# Patient Record
Sex: Male | Born: 1935 | Race: White | Hispanic: No | Smoking: Former smoker
Health system: Southern US, Community
[De-identification: ages and names within clinical notes are randomized; demographics above are authoritative.]

## PROBLEM LIST (undated history)

## (undated) DIAGNOSIS — R600 Localized edema: Secondary | ICD-10-CM

## (undated) DIAGNOSIS — G4733 Obstructive sleep apnea (adult) (pediatric): Secondary | ICD-10-CM

## (undated) DIAGNOSIS — R7303 Prediabetes: Secondary | ICD-10-CM

## (undated) DIAGNOSIS — E291 Testicular hypofunction: Secondary | ICD-10-CM

## (undated) DIAGNOSIS — I428 Other cardiomyopathies: Secondary | ICD-10-CM

## (undated) DIAGNOSIS — I4821 Permanent atrial fibrillation: Secondary | ICD-10-CM

## (undated) DIAGNOSIS — N200 Calculus of kidney: Secondary | ICD-10-CM

## (undated) DIAGNOSIS — E669 Obesity, unspecified: Secondary | ICD-10-CM

## (undated) DIAGNOSIS — R609 Edema, unspecified: Secondary | ICD-10-CM

## (undated) HISTORY — DX: Obstructive sleep apnea (adult) (pediatric): G47.33

## (undated) HISTORY — DX: Localized edema: R60.0

## (undated) HISTORY — DX: Permanent atrial fibrillation: I48.21

## (undated) HISTORY — DX: Other cardiomyopathies: I42.8

## (undated) HISTORY — DX: Obesity, unspecified: E66.9

## (undated) HISTORY — DX: Testicular hypofunction: E29.1

## (undated) HISTORY — DX: Edema, unspecified: R60.9

## (undated) HISTORY — DX: Calculus of kidney: N20.0

## (undated) HISTORY — DX: Prediabetes: R73.03

---

## 1998-12-26 ENCOUNTER — Encounter: Payer: Self-pay | Admitting: Family Medicine

## 1998-12-26 ENCOUNTER — Ambulatory Visit (HOSPITAL_COMMUNITY): Admission: RE | Admit: 1998-12-26 | Discharge: 1998-12-26 | Payer: Self-pay | Admitting: Family Medicine

## 2000-05-25 ENCOUNTER — Emergency Department (HOSPITAL_COMMUNITY): Admission: EM | Admit: 2000-05-25 | Discharge: 2000-05-25 | Payer: Self-pay | Admitting: Emergency Medicine

## 2000-05-25 ENCOUNTER — Encounter: Payer: Self-pay | Admitting: Emergency Medicine

## 2000-08-18 ENCOUNTER — Ambulatory Visit (HOSPITAL_BASED_OUTPATIENT_CLINIC_OR_DEPARTMENT_OTHER): Admission: RE | Admit: 2000-08-18 | Discharge: 2000-08-18 | Payer: Self-pay | Admitting: Family Medicine

## 2000-11-11 ENCOUNTER — Encounter: Payer: Self-pay | Admitting: Otolaryngology

## 2000-11-13 ENCOUNTER — Encounter (INDEPENDENT_AMBULATORY_CARE_PROVIDER_SITE_OTHER): Payer: Self-pay | Admitting: *Deleted

## 2000-11-13 ENCOUNTER — Ambulatory Visit (HOSPITAL_COMMUNITY): Admission: RE | Admit: 2000-11-13 | Discharge: 2000-11-14 | Payer: Self-pay | Admitting: Otolaryngology

## 2002-11-01 ENCOUNTER — Ambulatory Visit (HOSPITAL_COMMUNITY): Admission: RE | Admit: 2002-11-01 | Discharge: 2002-11-01 | Payer: Self-pay | Admitting: Urology

## 2002-11-01 ENCOUNTER — Encounter: Payer: Self-pay | Admitting: Urology

## 2003-10-13 ENCOUNTER — Encounter (INDEPENDENT_AMBULATORY_CARE_PROVIDER_SITE_OTHER): Payer: Self-pay | Admitting: Specialist

## 2003-10-13 ENCOUNTER — Ambulatory Visit (HOSPITAL_COMMUNITY): Admission: RE | Admit: 2003-10-13 | Discharge: 2003-10-13 | Payer: Self-pay | Admitting: *Deleted

## 2007-09-03 ENCOUNTER — Ambulatory Visit (HOSPITAL_COMMUNITY): Admission: RE | Admit: 2007-09-03 | Discharge: 2007-09-03 | Payer: Self-pay | Admitting: Urology

## 2007-09-10 ENCOUNTER — Ambulatory Visit (HOSPITAL_COMMUNITY): Admission: RE | Admit: 2007-09-10 | Discharge: 2007-09-10 | Payer: Self-pay | Admitting: Urology

## 2008-04-04 ENCOUNTER — Encounter: Admission: RE | Admit: 2008-04-04 | Discharge: 2008-04-04 | Payer: Self-pay | Admitting: Family Medicine

## 2009-08-15 ENCOUNTER — Ambulatory Visit (HOSPITAL_BASED_OUTPATIENT_CLINIC_OR_DEPARTMENT_OTHER): Admission: RE | Admit: 2009-08-15 | Discharge: 2009-08-15 | Payer: Self-pay | Admitting: Urology

## 2010-05-06 LAB — POCT I-STAT 4, (NA,K, GLUC, HGB,HCT)
Glucose, Bld: 110 mg/dL — ABNORMAL HIGH (ref 70–99)
Potassium: 4.8 mEq/L (ref 3.5–5.1)
Sodium: 147 mEq/L — ABNORMAL HIGH (ref 135–145)

## 2010-07-06 NOTE — H&P (Signed)
[place]  Patient:    Antonio Mendoza, Antonio Mendoza Visit Number: [id number] MRN: [id number]          Service Type: DSU Location: RCRM 2550 08 Attending Physician:  Waldon Merl Dictated by:   Keturah Barre, M.D. Admit Date:  11/13/2000   CC:         Meredith Staggers, M.D.   History and Physical  HISTORY OF PRESENT ILLNESS:  The patient is a 77 year old male who has had persistent sleep apnea problems.  He has had a split study which shows a moderate obstructive sleep apnea syndrome with a 26 RDI, lowest O2 85%.  He is awake 24 times in the night, more than two minutes 10 times.  He is chronically fatigued.  He just cannot seem to get enough sleep even though he will try to sleep 10 hours a day.  He had a normal cardiac rhythm during these times and a CPAP titration attempted was unsuccessful.  This was stopped because of mass discomfort.  He also has a severe septal deviation which would make CPAP quite essentially impossible.  He now enters for a septal reconstruction, turbinate reduction and a palatopharyngoplasty.  ALLERGIES:  No known drug allergies.  PAST SURGICAL HISTORY:  He has had a T&A in the past as a child.  Lithotripsy and colonoscopy.  He also had surgery for kidney stones.  He has had eye surgery on his left eye.  REVIEW OF SYSTEMS:  He had one decreased heart rate episode but no problem, his heart rate was 95.  He has been checked and found to be in good condition cardiacwise.  He has had no other GI, endocrine, hematology, neurology or pulmonary problems.  PHYSICAL EXAMINATION:  GENERAL APPEARANCE:  VITAL SIGNS:  Blood pressure 140/82, he is 6 feet 2-1/2 inches, weighs 235, his heart rate is 72.  HEENT:  His ears are clear.  His oral cavity is very small.  He shows evidence of history of of tonsillectomy.  His septum is deviated, blocking his nose. His larynx is clear.  No ulcerations or masses on true cords,  false cords, epiglottis, or base of tongue.  NECK:  Full.  No thyromegaly, cervical adenopathy or masses.  It is very supple and full.  CHEST:  Clear with no wheezing, rhonchi or rales.  CARDIOVASCULAR:  ______________, murmurs or gallops.  ABDOMEN:  No organomegaly, tenderness or mass.  INITIAL DIAGNOSES: 1. Sleep apnea with septal deviation, turbinate hypertrophy with a small oral    cavity, failed CPAP. 2. History of kidney stones. 3. History of colonoscopy in 2002. 4. History of lithotripsy and left eye surgery and T&A.  PLAN:  Do a palatopharyngoplasty and a septal reconstruction under general endotracheal anesthesia. Dictated by:   Keturah Barre, M.D. Attending Physician:  Waldon Merl DD:  11/13/00 TD:  11/13/00 Job: [id number]

## 2010-07-06 NOTE — Op Note (Signed)
[place]  Patient:    Antonio Mendoza, Antonio Mendoza Visit Number: [id number] MRN: [id number]          Service Type: DSU Location: RCRM 2550 08 Attending Physician:  Waldon Merl Dictated by:   Keturah Barre, M.D. Proc. Date: 11/13/00 Admit Date:  11/13/2000   CC:         Meredith Staggers, M.D.   Operative Report  HISTORY:  This patient is a 77 year old male who has had a sleep study who is chronically fatigued.  He is getting inadequate sleep.  His RDI show it to be respiratory disturbance index of 24, lowest 02 85.  He was up 24 times, 10 times greater than 2 minutes.  He is chronically fatigued and is falling asleep when he just sits down.  We also talked to him about the risks of the procedure, the persistent sore throat for at least 10 days to 2 weeks and he is not to travel for about 10 days to 2 weeks, and he needs to be on a very soft diet and bland diet.  He has also failed at CPAP, which he had split test.  Because he had severe septal deviation and nasal obstruction which made the CPAP inappropriate and also aggravate his sleep apnea.  PREOPERATIVE DIAGNOSIS:  Sleep apnea with septal deviation, turbinate hypertrophy.  POSTOPERATIVE DIAGNOSIS:  Sleep apnea with septal deviation, turbinate hypertrophy.  PROCEDURE:  Septal reconstruction turbinate reduction with a uvulopalatoplasty.  SURGEON:  Keturah Barre, M.D.  ANESTHESIA:  General endotracheal anesthesia.  ANESTHESIOLOGIST:  Bedelia Person, M.D.  DESCRIPTION OF PROCEDURE:  The patient was placed in the supine position under general endotracheal anesthesia the nose was first approached where the septum was found to be deviated grossly to the left.  His turbinates were quite large and they were reduced aggressively using the butter knife to crush them laterally but no mucous membrane was taken.  The septum was then apricot where a hemitransfixion incision was made in  the left side and carried back along the septal quadrilateral cartilage, elevating the mucous membrane back to the ethmoids and vomerine septal deviation.  A strip of cartilage was then taken from the floor of the nose as well as the posterior quadrilateral cartilage and then open and closed Jansen-Middleton were used to remove the septal ethmoid vomerine deviation and the remainder of the quadrilateral cartilage was placed back on its premaxillary crest.  Once the septum was established in the midline the closure was begun using a 5-0 plain catgut and a through and through septal suture was used x 2 as a hemostatic blanket stitch using 4-0 plain.  Once this was achieved Telfa dressing was placed.  Packing was placed, and then attention was carried to the oral cavity.  The tonsillar gag was placed after I repositioned and then the uvula and palate were redundant, low and increased in size.  The palate was trimmed removing approximately 6 mm of its lower edge.  No muscle was taken, and the palate was also increased in size and this was trimmed taking approximately 1/2 to make it more appropriate in size.  The palatal mucous membrane and uvula mucous membrane was sutured anterior and posterior, mucous membrane approximated.  Final hemostasis was established with Bovie electrocoagulation.  Once this was completely dry the stomach was suctioned.  The nasopharynx was suctioned.  The packing was removed and anesthesia trumpets were placed, #8 and #7 ______ and the patient was taken  to recovery room in good condition with an excellent airway.  The patient tolerated the procedure very well, did well postoperative and his follow up will be overnight pulse oximeter observation and then he will be seeing me in the office in 1 week, 2 weeks, 3 weeks, 6 weeks, 3 months, 6 months and 1 year. Dictated by:   Keturah Barre, M.D. Attending Physician:  Waldon Merl DD:  11/13/00 TD:   11/13/00 Job: [id number]

## 2010-11-16 LAB — COMPREHENSIVE METABOLIC PANEL
AST: 40 — ABNORMAL HIGH
CO2: 20
Calcium: 8.9
Creatinine, Ser: 1.83 — ABNORMAL HIGH
GFR calc Af Amer: 44 — ABNORMAL LOW
GFR calc non Af Amer: 36 — ABNORMAL LOW

## 2010-11-16 LAB — CBC
MCHC: 34.1
MCV: 94.2
RBC: 5.19
RDW: 14.4

## 2011-06-28 DIAGNOSIS — J40 Bronchitis, not specified as acute or chronic: Secondary | ICD-10-CM | POA: Diagnosis not present

## 2011-07-17 DIAGNOSIS — N3020 Other chronic cystitis without hematuria: Secondary | ICD-10-CM | POA: Diagnosis not present

## 2011-07-17 DIAGNOSIS — E291 Testicular hypofunction: Secondary | ICD-10-CM | POA: Diagnosis not present

## 2011-07-17 DIAGNOSIS — N200 Calculus of kidney: Secondary | ICD-10-CM | POA: Diagnosis not present

## 2011-08-12 DIAGNOSIS — L570 Actinic keratosis: Secondary | ICD-10-CM | POA: Diagnosis not present

## 2011-08-12 DIAGNOSIS — D485 Neoplasm of uncertain behavior of skin: Secondary | ICD-10-CM | POA: Diagnosis not present

## 2011-08-15 DIAGNOSIS — E291 Testicular hypofunction: Secondary | ICD-10-CM | POA: Diagnosis not present

## 2011-10-28 DIAGNOSIS — I4891 Unspecified atrial fibrillation: Secondary | ICD-10-CM | POA: Diagnosis not present

## 2011-10-28 DIAGNOSIS — E669 Obesity, unspecified: Secondary | ICD-10-CM | POA: Diagnosis not present

## 2011-10-28 DIAGNOSIS — I428 Other cardiomyopathies: Secondary | ICD-10-CM | POA: Diagnosis not present

## 2011-11-15 ENCOUNTER — Other Ambulatory Visit: Payer: Self-pay | Admitting: Gastroenterology

## 2011-11-15 DIAGNOSIS — Z09 Encounter for follow-up examination after completed treatment for conditions other than malignant neoplasm: Secondary | ICD-10-CM | POA: Diagnosis not present

## 2011-11-15 DIAGNOSIS — K648 Other hemorrhoids: Secondary | ICD-10-CM | POA: Diagnosis not present

## 2011-11-15 DIAGNOSIS — Z86010 Personal history of colonic polyps: Secondary | ICD-10-CM | POA: Diagnosis not present

## 2011-11-15 DIAGNOSIS — K5730 Diverticulosis of large intestine without perforation or abscess without bleeding: Secondary | ICD-10-CM | POA: Diagnosis not present

## 2011-11-15 DIAGNOSIS — D126 Benign neoplasm of colon, unspecified: Secondary | ICD-10-CM | POA: Diagnosis not present

## 2011-11-18 DIAGNOSIS — Z79899 Other long term (current) drug therapy: Secondary | ICD-10-CM | POA: Diagnosis not present

## 2012-01-02 DIAGNOSIS — H2510 Age-related nuclear cataract, unspecified eye: Secondary | ICD-10-CM | POA: Diagnosis not present

## 2012-01-20 DIAGNOSIS — N400 Enlarged prostate without lower urinary tract symptoms: Secondary | ICD-10-CM | POA: Diagnosis not present

## 2012-01-20 DIAGNOSIS — N3020 Other chronic cystitis without hematuria: Secondary | ICD-10-CM | POA: Diagnosis not present

## 2012-01-20 DIAGNOSIS — E291 Testicular hypofunction: Secondary | ICD-10-CM | POA: Diagnosis not present

## 2012-04-16 DIAGNOSIS — Z79899 Other long term (current) drug therapy: Secondary | ICD-10-CM | POA: Diagnosis not present

## 2012-04-16 DIAGNOSIS — I4891 Unspecified atrial fibrillation: Secondary | ICD-10-CM | POA: Diagnosis not present

## 2012-04-27 DIAGNOSIS — L219 Seborrheic dermatitis, unspecified: Secondary | ICD-10-CM | POA: Diagnosis not present

## 2012-05-01 DIAGNOSIS — E291 Testicular hypofunction: Secondary | ICD-10-CM | POA: Diagnosis not present

## 2012-05-01 DIAGNOSIS — R972 Elevated prostate specific antigen [PSA]: Secondary | ICD-10-CM | POA: Diagnosis not present

## 2012-07-15 DIAGNOSIS — E291 Testicular hypofunction: Secondary | ICD-10-CM | POA: Diagnosis not present

## 2012-07-27 DIAGNOSIS — I4891 Unspecified atrial fibrillation: Secondary | ICD-10-CM | POA: Diagnosis not present

## 2012-07-27 DIAGNOSIS — R609 Edema, unspecified: Secondary | ICD-10-CM | POA: Diagnosis not present

## 2012-07-27 DIAGNOSIS — I428 Other cardiomyopathies: Secondary | ICD-10-CM | POA: Diagnosis not present

## 2012-07-27 DIAGNOSIS — R0602 Shortness of breath: Secondary | ICD-10-CM | POA: Diagnosis not present

## 2012-09-10 DIAGNOSIS — R040 Epistaxis: Secondary | ICD-10-CM | POA: Diagnosis not present

## 2012-10-15 DIAGNOSIS — I4891 Unspecified atrial fibrillation: Secondary | ICD-10-CM | POA: Diagnosis not present

## 2012-10-15 DIAGNOSIS — Z79899 Other long term (current) drug therapy: Secondary | ICD-10-CM | POA: Diagnosis not present

## 2012-10-23 DIAGNOSIS — D751 Secondary polycythemia: Secondary | ICD-10-CM | POA: Diagnosis not present

## 2012-10-23 DIAGNOSIS — N3020 Other chronic cystitis without hematuria: Secondary | ICD-10-CM | POA: Diagnosis not present

## 2012-10-23 DIAGNOSIS — R972 Elevated prostate specific antigen [PSA]: Secondary | ICD-10-CM | POA: Diagnosis not present

## 2012-10-23 DIAGNOSIS — E291 Testicular hypofunction: Secondary | ICD-10-CM | POA: Diagnosis not present

## 2012-11-16 DIAGNOSIS — IMO0002 Reserved for concepts with insufficient information to code with codable children: Secondary | ICD-10-CM | POA: Diagnosis not present

## 2012-11-24 DIAGNOSIS — Z23 Encounter for immunization: Secondary | ICD-10-CM | POA: Diagnosis not present

## 2012-11-24 DIAGNOSIS — L02519 Cutaneous abscess of unspecified hand: Secondary | ICD-10-CM | POA: Diagnosis not present

## 2012-11-24 DIAGNOSIS — Z0000 Encounter for general adult medical examination without abnormal findings: Secondary | ICD-10-CM | POA: Diagnosis not present

## 2012-11-24 DIAGNOSIS — I4891 Unspecified atrial fibrillation: Secondary | ICD-10-CM | POA: Diagnosis not present

## 2012-11-27 DIAGNOSIS — N3020 Other chronic cystitis without hematuria: Secondary | ICD-10-CM | POA: Diagnosis not present

## 2012-11-27 DIAGNOSIS — R972 Elevated prostate specific antigen [PSA]: Secondary | ICD-10-CM | POA: Diagnosis not present

## 2012-11-27 DIAGNOSIS — D751 Secondary polycythemia: Secondary | ICD-10-CM | POA: Diagnosis not present

## 2012-12-17 ENCOUNTER — Telehealth: Payer: Self-pay

## 2012-12-17 MED ORDER — APIXABAN 5 MG PO TABS: 5.0000 mg | ORAL_TABLET | Freq: Two times a day (BID) | ORAL | Status: DC

## 2012-12-17 NOTE — Telephone Encounter (Signed)
Refilled

## 2013-01-22 ENCOUNTER — Telehealth: Payer: Self-pay | Admitting: *Deleted

## 2013-01-22 DIAGNOSIS — R972 Elevated prostate specific antigen [PSA]: Secondary | ICD-10-CM | POA: Diagnosis not present

## 2013-01-22 NOTE — Telephone Encounter (Signed)
Patient requests refill of lasix to be sent to [facility]. Thanks, MI

## 2013-01-25 MED ORDER — FUROSEMIDE 40 MG PO TABS: 40.0000 mg | ORAL_TABLET | Freq: Every day | ORAL | Status: DC

## 2013-01-25 NOTE — Telephone Encounter (Signed)
Refilled

## 2013-01-27 DIAGNOSIS — E291 Testicular hypofunction: Secondary | ICD-10-CM | POA: Diagnosis not present

## 2013-01-27 DIAGNOSIS — N200 Calculus of kidney: Secondary | ICD-10-CM | POA: Diagnosis not present

## 2013-01-27 DIAGNOSIS — N3020 Other chronic cystitis without hematuria: Secondary | ICD-10-CM | POA: Diagnosis not present

## 2013-01-27 DIAGNOSIS — R6882 Decreased libido: Secondary | ICD-10-CM | POA: Diagnosis not present

## 2013-03-02 DIAGNOSIS — E291 Testicular hypofunction: Secondary | ICD-10-CM | POA: Diagnosis not present

## 2013-03-04 DIAGNOSIS — Z961 Presence of intraocular lens: Secondary | ICD-10-CM | POA: Diagnosis not present

## 2013-03-04 DIAGNOSIS — H2510 Age-related nuclear cataract, unspecified eye: Secondary | ICD-10-CM | POA: Diagnosis not present

## 2013-03-08 DIAGNOSIS — N200 Calculus of kidney: Secondary | ICD-10-CM | POA: Diagnosis not present

## 2013-03-08 DIAGNOSIS — N3020 Other chronic cystitis without hematuria: Secondary | ICD-10-CM | POA: Diagnosis not present

## 2013-03-14 ENCOUNTER — Other Ambulatory Visit: Payer: Self-pay | Admitting: *Deleted

## 2013-03-14 DIAGNOSIS — Z79899 Other long term (current) drug therapy: Secondary | ICD-10-CM

## 2013-03-14 DIAGNOSIS — I4891 Unspecified atrial fibrillation: Secondary | ICD-10-CM

## 2013-04-19 ENCOUNTER — Other Ambulatory Visit: Payer: Self-pay

## 2013-04-20 ENCOUNTER — Telehealth: Payer: Self-pay | Admitting: Interventional Cardiology

## 2013-04-20 MED ORDER — METOPROLOL SUCCINATE ER 100 MG PO TB24: 100.0000 mg | ORAL_TABLET | Freq: Every day | ORAL | Status: DC

## 2013-04-20 MED ORDER — APIXABAN 5 MG PO TABS: 5.0000 mg | ORAL_TABLET | Freq: Two times a day (BID) | ORAL | Status: DC

## 2013-04-20 MED ORDER — FUROSEMIDE 40 MG PO TABS: 40.0000 mg | ORAL_TABLET | Freq: Every day | ORAL | Status: DC

## 2013-04-20 MED ORDER — LISINOPRIL 5 MG PO TABS: 5.0000 mg | ORAL_TABLET | Freq: Every day | ORAL | Status: DC

## 2013-04-20 NOTE — Telephone Encounter (Signed)
New message    Patient calling need a refill on all 4 medication    Sent to: [facility] [phone]

## 2013-04-20 NOTE — Telephone Encounter (Signed)
REFILLED. [facility].

## 2013-05-12 DIAGNOSIS — E291 Testicular hypofunction: Secondary | ICD-10-CM | POA: Diagnosis not present

## 2013-05-19 DIAGNOSIS — D751 Secondary polycythemia: Secondary | ICD-10-CM | POA: Diagnosis not present

## 2013-05-19 DIAGNOSIS — E291 Testicular hypofunction: Secondary | ICD-10-CM | POA: Diagnosis not present

## 2013-05-19 DIAGNOSIS — N3020 Other chronic cystitis without hematuria: Secondary | ICD-10-CM | POA: Diagnosis not present

## 2013-07-26 ENCOUNTER — Encounter: Payer: Self-pay | Admitting: Cardiology

## 2013-07-26 ENCOUNTER — Encounter: Payer: Self-pay | Admitting: Interventional Cardiology

## 2013-07-26 ENCOUNTER — Encounter (INDEPENDENT_AMBULATORY_CARE_PROVIDER_SITE_OTHER): Payer: Self-pay

## 2013-07-26 ENCOUNTER — Ambulatory Visit (INDEPENDENT_AMBULATORY_CARE_PROVIDER_SITE_OTHER): Payer: Medicare Other | Admitting: Interventional Cardiology

## 2013-07-26 VITALS — BP 122/73 | HR 66 | Ht 74.5 in | Wt 241.4 lb

## 2013-07-26 DIAGNOSIS — G4733 Obstructive sleep apnea (adult) (pediatric): Secondary | ICD-10-CM | POA: Insufficient documentation

## 2013-07-26 DIAGNOSIS — Z8679 Personal history of other diseases of the circulatory system: Secondary | ICD-10-CM | POA: Insufficient documentation

## 2013-07-26 DIAGNOSIS — I4891 Unspecified atrial fibrillation: Secondary | ICD-10-CM | POA: Diagnosis not present

## 2013-07-26 DIAGNOSIS — I428 Other cardiomyopathies: Secondary | ICD-10-CM

## 2013-07-26 DIAGNOSIS — R609 Edema, unspecified: Secondary | ICD-10-CM

## 2013-07-26 DIAGNOSIS — I4821 Permanent atrial fibrillation: Secondary | ICD-10-CM | POA: Insufficient documentation

## 2013-07-26 HISTORY — DX: Other cardiomyopathies: I42.8

## 2013-07-26 LAB — CBC
HCT: 48.7 % (ref 39.0–52.0)
HEMOGLOBIN: 16.4 g/dL (ref 13.0–17.0)
MCHC: 33.7 g/dL (ref 30.0–36.0)
MCV: 96.9 fl (ref 78.0–100.0)
Platelets: 217 10*3/uL (ref 150.0–400.0)
RBC: 5.03 Mil/uL (ref 4.22–5.81)
RDW: 14.4 % (ref 11.5–15.5)
WBC: 8.8 10*3/uL (ref 4.0–10.5)

## 2013-07-26 LAB — BASIC METABOLIC PANEL
BUN: 17 mg/dL (ref 6–23)
CALCIUM: 9.3 mg/dL (ref 8.4–10.5)
CO2: 29 mEq/L (ref 19–32)
Chloride: 105 mEq/L (ref 96–112)
Creatinine, Ser: 1.1 mg/dL (ref 0.4–1.5)
GFR: 66.74 mL/min (ref >60.00)
GLUCOSE: 83 mg/dL (ref 70–99)
POTASSIUM: 4 meq/L (ref 3.5–5.1)
Sodium: 140 mEq/L (ref 135–145)

## 2013-07-26 NOTE — Progress Notes (Signed)
Patient ID: Antonio Mendoza, male   DOB: Jul 07, 1929, 80 y.o.   MRN: [id number]    [place], [place],   [address] Phone: [phone] Fax:  [phone]  Date:  07/26/2013   ID:  Antonio Mendoza, DOB 1929/05/28, MRN [id number]  PCP:  Vena Austria, MD      History of Present Illness: Antonio Mendoza is a 80 y.o. male who has chronic AFib and mildly decreased LV function. He walks twice a week and feels okay with that, but smetiumes, this drops off. He skeet shoots. Occasional chest pain after holding his gun. No chest pain related to walking. Atrial Fibrillation F/U:  He feels that his balance is off. He walks twice a week without problems.  No falls.  c/o Leg edema more at the end of the day; wears compression stockings.  c/o Shortness of breath exacerbated by activity. He has used advair in the past without much change..  Denies : Chest pain.  Dizziness while getting up from sitting position.  Orthopnea.  Palpitations.  Syncope.   Has more daytime sleepiness, doesn't wear his CPAP at night. Feels his balance is off as he gets older. Otherwise feels well. Denies dizziness, syncope, near syncope, fall, dyspnea, palpitations, chest pain, orthopnea, PND, LE edema, and stroke-like symptoms. No bleeding. BP is well controlled at home, usually 115-120/70-75.  Exercises once a week, walks for about 59min.    Wt Readings from Last 3 Encounters:  07/26/13 241 lb 6.4 oz (109.498 kg)     Past Medical History  Diagnosis Date  . Sleep apnea     tried cpap , but did not tolerate   . A-fib     since 1996  . Kidney stones   . Hypogonadism male     followed by urologist     Current Outpatient Prescriptions  Medication Sig Dispense Refill  . apixaban (ELIQUIS) 5 MG TABS tablet Take 1 tablet (5 mg total) by mouth 2 (two) times daily.  180 tablet  1  . furosemide (LASIX) 40 MG tablet Take 1 tablet (40 mg total) by mouth daily.  90 tablet  1  . lisinopril  (PRINIVIL,ZESTRIL) 5 MG tablet Take 1 tablet (5 mg total) by mouth daily.  90 tablet  3  . metoprolol succinate (TOPROL-XL) 100 MG 24 hr tablet Take 1 tablet (100 mg total) by mouth daily. Take with or immediately following a meal.  90 tablet  1  . sildenafil (VIAGRA) 50 MG tablet Take 50 mg by mouth daily as needed for erectile dysfunction.      . Testosterone (ANDROGEL) 40.5 MG/2.5GM (1.62%) GEL Place onto the skin.       No current facility-administered medications for this visit.    Allergies:   No Known Allergies  Social History:  The patient  reports that he has quit smoking. He does not have any smokeless tobacco history on file. He reports that he drinks alcohol. He reports that he does not use illicit drugs.   Family History:  The patient's family history includes CAD in his brother.   ROS:  Please see the history of present illness.  No nausea, vomiting.  No fevers, chills.  No focal weakness.  No dysuria.   All other systems reviewed and negative.   PHYSICAL EXAM: VS:  BP 122/73  Pulse 66  Ht 6' 2.5" (1.892 m)  Wt 241 lb 6.4 oz (109.498 kg)  BMI 30.59 kg/m2 Well nourished, well developed,  in no acute distress HEENT: normal Neck: no JVD, no carotid bruits Cardiac:  normal S1, S2; irregularly irregular Lungs:  clear to auscultation bilaterally, no wheezing, rhonchi or rales Abd: soft, nontender, no hepatomegaly Ext: no edema Skin: warm and dry Neuro:   no focal abnormalities noted  EKG:  Afib , rate controlled, Q waves in lead III.    ASSESSMENT AND PLAN:  Atrial fibrillation  Continue Metoprolol Succinate Tablet Extended Release 24 Hour, 100 mg, TAKE 1 TABLET ONE TIME DAILY Continue Eliquis 5 mg Tablet, ., 1 tablet, orally, twice a day IMAGING: EKG   Harward,Amy 07/27/2012 09:39:56 AM > Marcie Shearon,JAY 07/27/2012 10:00:15 AM > AFib, rate controlled.   Notes: No sx of tachycardia.  2. Cardiomyopathy  Continue Lisinopril Tablet, 5 MG, TAKE 1 TABLET ONE TIME  DAILY Continue Furosemide Tablet, 40 MG, TAKE 1 TABLET ONE TIME DAILY Notes: EF 50-55% in the past.  3. Shortness of breath  LAB: Basic Metabolic Cr 1.3 in 1610 LAB: BNP in 79 in 2014 Notes: Better today. 4. Edema of legs  Notes: Improved with compression stockings.   5. Fatigue: may be related to OSA.  He does not tolerate CPAP. Also has had problems getting testosterone. Will recheck sleep study.   Signed, Mina Marble, MD, [facility] 07/26/2013 10:12 AM

## 2013-07-26 NOTE — Patient Instructions (Signed)
Your physician has recommended that you have a sleep study. This test records several body functions during sleep, including: brain activity, eye movement, oxygen and carbon dioxide blood levels, heart rate and rhythm, breathing rate and rhythm, the flow of air through your mouth and nose, snoring, body muscle movements, and chest and belly movement.  Your physician recommends that you return for lab work today for cbc and bmet.  Your physician wants you to follow-up in: 1 year with Dr. Irish Lack.  You will receive a reminder letter in the mail two months in advance. If you don't receive a letter, please call our office to schedule the follow-up appointment.

## 2013-07-27 ENCOUNTER — Other Ambulatory Visit: Payer: Self-pay | Admitting: Cardiology

## 2013-07-27 DIAGNOSIS — I4891 Unspecified atrial fibrillation: Secondary | ICD-10-CM

## 2013-08-02 ENCOUNTER — Other Ambulatory Visit: Payer: Self-pay

## 2013-08-02 MED ORDER — LISINOPRIL 5 MG PO TABS: 5.0000 mg | ORAL_TABLET | Freq: Every day | ORAL | Status: DC

## 2013-08-02 MED ORDER — METOPROLOL SUCCINATE ER 100 MG PO TB24: 100.0000 mg | ORAL_TABLET | Freq: Every day | ORAL | Status: DC

## 2013-08-02 MED ORDER — FUROSEMIDE 40 MG PO TABS: 40.0000 mg | ORAL_TABLET | Freq: Every day | ORAL | Status: DC

## 2013-08-02 MED ORDER — APIXABAN 5 MG PO TABS: 5.0000 mg | ORAL_TABLET | Freq: Two times a day (BID) | ORAL | Status: DC

## 2013-08-11 ENCOUNTER — Encounter: Payer: Self-pay | Admitting: Cardiology

## 2013-08-11 DIAGNOSIS — G4733 Obstructive sleep apnea (adult) (pediatric): Secondary | ICD-10-CM | POA: Diagnosis not present

## 2013-08-18 ENCOUNTER — Telehealth: Payer: Self-pay | Admitting: Cardiology

## 2013-08-18 NOTE — Telephone Encounter (Signed)
Please let patient know that he has mild OSA and set up for CPAP titration

## 2013-08-19 NOTE — Telephone Encounter (Signed)
Pt is aware. Gave form to [place] to fax over to [place]

## 2013-08-30 ENCOUNTER — Telehealth: Payer: Self-pay | Admitting: Cardiology

## 2013-08-30 NOTE — Telephone Encounter (Signed)
Will forward to [facility].

## 2013-08-30 NOTE — Telephone Encounter (Signed)
New message     Pt waiting to hear from the nurse to set up a second sleep study.  He was not able to complete the first sleep study. He can go any night.

## 2013-08-31 NOTE — Telephone Encounter (Signed)
Was unable to reach [facility]. Will call after 8 AM.

## 2013-08-31 NOTE — Telephone Encounter (Signed)
LVM with [facility] to return call about scheduling pt

## 2013-09-01 NOTE — Telephone Encounter (Signed)
Las message was put into system in [place]

## 2013-09-01 NOTE — Telephone Encounter (Signed)
LVM for [facility] to respond back in regards to scheduling pt/

## 2013-09-01 NOTE — Telephone Encounter (Signed)
Spoke with Andee Poles From [facility] and she stated that pt is in a lot of pain per pts daughter. They are not scheduled yet but would prefer to get this done before Dr Radford Pax returns back to office next Tuesday

## 2013-09-01 NOTE — Telephone Encounter (Signed)
Called pt to make aware I am waiting on a call back from [facility] about his CPAP Titration. LM for pt to return call to make aware.

## 2013-09-02 ENCOUNTER — Other Ambulatory Visit: Payer: Self-pay | Admitting: General Surgery

## 2013-09-02 DIAGNOSIS — G4733 Obstructive sleep apnea (adult) (pediatric): Secondary | ICD-10-CM

## 2013-09-02 NOTE — Telephone Encounter (Signed)
Antonio Mendoza will schedule at [facility]

## 2013-09-02 NOTE — Telephone Encounter (Signed)
[facility] is closing. Since pt has been asking we will switch over to [facility]. Vaughan Basta is working on setting this up for pt.

## 2013-10-17 ENCOUNTER — Ambulatory Visit (HOSPITAL_BASED_OUTPATIENT_CLINIC_OR_DEPARTMENT_OTHER): Payer: Medicare Other | Attending: Cardiology

## 2013-10-17 VITALS — Ht 74.0 in | Wt 236.0 lb

## 2013-10-17 DIAGNOSIS — G4733 Obstructive sleep apnea (adult) (pediatric): Secondary | ICD-10-CM | POA: Insufficient documentation

## 2013-10-24 ENCOUNTER — Encounter (HOSPITAL_BASED_OUTPATIENT_CLINIC_OR_DEPARTMENT_OTHER): Payer: No Typology Code available for payment source

## 2013-11-05 ENCOUNTER — Telehealth: Payer: Self-pay | Admitting: Cardiology

## 2013-11-05 ENCOUNTER — Other Ambulatory Visit: Payer: Self-pay | Admitting: General Surgery

## 2013-11-05 DIAGNOSIS — G4733 Obstructive sleep apnea (adult) (pediatric): Secondary | ICD-10-CM

## 2013-11-05 NOTE — Telephone Encounter (Signed)
Please let patient know that he had successful CPAP titration.  Please order a Resmed CPAP device set at 12cm H2O with heated humidifier, Cflex of 3 and medium Simplex full face mask and have him followup with me in 10 weeks

## 2013-11-05 NOTE — Telephone Encounter (Signed)
Pt is aware and set up for appt.

## 2013-11-05 NOTE — Telephone Encounter (Signed)
Order placed to [facility]

## 2013-11-05 NOTE — Sleep Study (Signed)
NAME:  Antonio Mendoza DATE OF BIRTH:  January 06, 1930 MEDICAL RECORD NUMBER [id number] LOCATION:  [facility] PHYSICIAN:  Sueanne Margarita DATE OF STUDY:  11/17/2013  SLEEP STUDY TYPE:  CPAP titration study  REFERRING PHYSICIAN:  Fransico Him, MD  INDICATION FOR STUDY:  Hypersomnia with nonrestorative sleep  EPWORTH SLEEPINESS SCORE:  10 HEIGHT: 6'2" WEIGHT: 236lbs NECK SIZE: 17" BMI 30  MEDICATION:  Reviewed in the sleep record  SLEEP ARCHITECTURE:  The patient had a total sleep time of 282 minutes with no slow wave sleep and only 48 minutes of REM sleep.  Sleep onset latency was 12 minutes and onset to REM sleep was 77 minutes.  Sleep efficiency was normal at 94%.  RESPIRATORY DATA:  The patient had a baseline O2sat of 93% with lowest O2 sat in REM of 90% and in NREM of 85%.  The patient was started on CPAP at 5cm H2O and titrated to 12cm H2O.  The AHI was 4.2/hr on 12cm H2O and patient was able to achieve REM supine sleep at this pressure.  Lowest O2 sat was 90%.  CARDIAC DATA:  The patient maintained NSR during the study with an average HR of 62bpm.  IMPRESSION/RECOMMENDATION: 1.  The patient should be started on Resmed CPAP at 12cm H2O with a heated humidifier, C flex of 3 and medium Simplus full face mask. 2.  The patient should be counseled in good sleep hygiene and weight loss. 3.  The patient should be counseled to avoid sleeping supine. 4.  The patient will followup with Dr. Radford Pax in 10 weeks for sleep OV.

## 2013-11-18 DIAGNOSIS — R972 Elevated prostate specific antigen [PSA]: Secondary | ICD-10-CM | POA: Diagnosis not present

## 2013-11-18 DIAGNOSIS — E291 Testicular hypofunction: Secondary | ICD-10-CM | POA: Diagnosis not present

## 2013-11-19 ENCOUNTER — Encounter: Payer: Self-pay | Admitting: Cardiology

## 2013-11-20 ENCOUNTER — Encounter: Payer: Self-pay | Admitting: Cardiology

## 2013-12-01 DIAGNOSIS — E291 Testicular hypofunction: Secondary | ICD-10-CM | POA: Diagnosis not present

## 2013-12-01 DIAGNOSIS — N390 Urinary tract infection, site not specified: Secondary | ICD-10-CM | POA: Diagnosis not present

## 2014-01-17 ENCOUNTER — Encounter: Payer: Self-pay | Admitting: Cardiology

## 2014-01-17 ENCOUNTER — Ambulatory Visit (INDEPENDENT_AMBULATORY_CARE_PROVIDER_SITE_OTHER): Payer: Medicare Other | Admitting: Cardiology

## 2014-01-17 ENCOUNTER — Ambulatory Visit: Payer: Medicare Other | Admitting: Cardiology

## 2014-01-17 VITALS — BP 138/70 | HR 86 | Ht 74.0 in | Wt 241.8 lb

## 2014-01-17 DIAGNOSIS — G4733 Obstructive sleep apnea (adult) (pediatric): Secondary | ICD-10-CM

## 2014-01-17 DIAGNOSIS — E669 Obesity, unspecified: Secondary | ICD-10-CM

## 2014-01-17 HISTORY — DX: Obesity, unspecified: E66.9

## 2014-01-17 NOTE — Progress Notes (Signed)
[place], [place], [place]  [address] Phone: [phone] Fax:  [phone]  Date:  01/17/2014   ID:  Antonio Mendoza, DOB 1929/06/25, MRN [id number]  PCP:  Vena Austria, MD  Sleep medicine:  Fransico Him, MD    History of Present Illness: Antonio Mendoza is a 80 y.o. male with a history of atrial fibrillation and OSA who Dr. Irish Lack referred for sleep study. He had been having some daytime sleepiness and falling asleep in the afternoon.  He was diagnosed with mild OSA (AHI 5.24/hr) and underwent CPAP titration to 12cm H2O. He presents now for followup. He is doing well with his CPAP device.  He tolerates the full mask and feels the pressure is adequate.  He feels rested in the am and has no daytime sleepiness.  Since starting CPAP he is waking up less at night.  He has had an improvement in his daytime sleepiness if he gets enough hours of sleep.  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     Wt Readings from Last 3 Encounters:  01/17/14 241 lb 12.8 oz (109.68 kg)  10/17/13 236 lb (107.049 kg)  07/26/13 241 lb 6.4 oz (109.498 kg)     Past Medical History  Diagnosis Date  . Sleep apnea     tried cpap , but did not tolerate   . A-fib     since 1996  . Kidney stones   . Hypogonadism male     followed by urologist     Current Outpatient Prescriptions  Medication Sig Dispense Refill  . apixaban (ELIQUIS) 5 MG TABS tablet Take 1 tablet (5 mg total) by mouth 2 (two) times daily. 180 tablet 1  . furosemide (LASIX) 40 MG tablet Take 1 tablet (40 mg total) by mouth daily. 90 tablet 1  . lisinopril (PRINIVIL,ZESTRIL) 5 MG tablet Take 1 tablet (5 mg total) by mouth daily. 90 tablet 3  . metoprolol succinate (TOPROL-XL) 100 MG 24 hr tablet Take 1 tablet (100 mg total) by mouth daily. Take with or immediately following a meal. 90 tablet 1  . sildenafil (VIAGRA) 50 MG tablet Take 50 mg by  mouth daily as needed for erectile dysfunction.    . Testosterone (ANDROGEL) 40.5 MG/2.5GM (1.62%) GEL Place onto the skin.     No current facility-administered medications for this visit.    Allergies:   No Known Allergies  Social History:  The patient  reports that he has quit smoking. He does not have any smokeless tobacco history on file. He reports that he drinks alcohol. He reports that he does not use illicit drugs.   Family History:  The patient's family history includes CAD in his brother.   ROS:  Please see the history of present illness.      All other systems reviewed and negative.   PHYSICAL EXAM: VS:  BP 138/70 mmHg  Pulse 86  Ht 6\' 2"  (1.88 m)  Wt 241 lb 12.8 oz (109.68 kg)  BMI 31.03 kg/m2 Well nourished, well developed, in no acute distress HEENT: normal Neck: no JVD Cardiac:  normal S1, S2; RRR; no murmur Lungs:  clear to auscultation bilaterally, no wheezing, rhonchi or rales Abd: soft, nontender, no hepatomegaly Ext: no edema Skin: warm and dry Neuro:  CNs 2-12 intact, no focal abnormalities noted       ASSESSMENT AND  PLAN:  1. Mild OSA on CPAP and tolerating well.  I will get a d/l from his DME 2.  Obesity - I have encouraged him to get into a routine aerobic exercise routine.  Followup with me in 6 months  Signed, Fransico Him, MD [facility]: [facility] 01/17/2014 11:09 AM

## 2014-01-17 NOTE — Patient Instructions (Signed)
Your physician wants you to follow-up in: 6 months with Dr. Radford Pax. You will receive a reminder letter in the mail two months in advance. If you don't receive a letter, please call our office to schedule the follow-up appointment.

## 2014-01-20 ENCOUNTER — Encounter: Payer: Self-pay | Admitting: Cardiology

## 2014-01-25 ENCOUNTER — Other Ambulatory Visit: Payer: No Typology Code available for payment source

## 2014-02-21 ENCOUNTER — Telehealth: Payer: Self-pay | Admitting: Interventional Cardiology

## 2014-02-21 NOTE — Telephone Encounter (Signed)
New message      Pt has a new pharmacy for 2016.  Please have all presc refilled at [facility] mail order [phone].  Lasix, metoprolol and lisinopril is not due to be refilled.  However, his eliquis is due to be refilled now.

## 2014-02-22 ENCOUNTER — Other Ambulatory Visit: Payer: Self-pay | Admitting: *Deleted

## 2014-02-22 MED ORDER — APIXABAN 5 MG PO TABS: 5.0000 mg | ORAL_TABLET | Freq: Two times a day (BID) | ORAL | Status: DC

## 2014-03-02 DIAGNOSIS — E291 Testicular hypofunction: Secondary | ICD-10-CM | POA: Diagnosis not present

## 2014-03-09 DIAGNOSIS — E291 Testicular hypofunction: Secondary | ICD-10-CM | POA: Diagnosis not present

## 2014-03-09 DIAGNOSIS — R972 Elevated prostate specific antigen [PSA]: Secondary | ICD-10-CM | POA: Diagnosis not present

## 2014-03-09 DIAGNOSIS — N3020 Other chronic cystitis without hematuria: Secondary | ICD-10-CM | POA: Diagnosis not present

## 2014-03-25 NOTE — Progress Notes (Signed)
Spoke with the patient. No recent labs done- he sill come on 2/12 for repeat labs.

## 2014-04-01 ENCOUNTER — Other Ambulatory Visit (INDEPENDENT_AMBULATORY_CARE_PROVIDER_SITE_OTHER): Payer: Medicare Other | Admitting: *Deleted

## 2014-04-01 DIAGNOSIS — I4891 Unspecified atrial fibrillation: Secondary | ICD-10-CM

## 2014-04-01 LAB — CBC
HEMATOCRIT: 46.5 % (ref 39.0–52.0)
HEMOGLOBIN: 16 g/dL (ref 13.0–17.0)
MCHC: 34.4 g/dL (ref 30.0–36.0)
MCV: 94.9 fl (ref 78.0–100.0)
Platelets: 204 10*3/uL (ref 150.0–400.0)
RBC: 4.9 Mil/uL (ref 4.22–5.81)
RDW: 14.3 % (ref 11.5–15.5)
WBC: 8.2 10*3/uL (ref 4.0–10.5)

## 2014-04-01 LAB — BASIC METABOLIC PANEL
BUN: 21 mg/dL (ref 6–23)
CHLORIDE: 107 meq/L (ref 96–112)
CO2: 29 mEq/L (ref 19–32)
CREATININE: 1.29 mg/dL (ref 0.40–1.50)
Calcium: 9.3 mg/dL (ref 8.4–10.5)
GFR: 56.6 mL/min — ABNORMAL LOW (ref >60.00)
Glucose, Bld: 102 mg/dL — ABNORMAL HIGH (ref 70–99)
POTASSIUM: 3.9 meq/L (ref 3.5–5.1)
SODIUM: 141 meq/L (ref 135–145)

## 2014-04-06 ENCOUNTER — Telehealth: Payer: Self-pay | Admitting: Interventional Cardiology

## 2014-04-06 NOTE — Telephone Encounter (Signed)
Follow Up       Pt returning phone call from [facility].

## 2014-04-06 NOTE — Telephone Encounter (Signed)
Left message for patient to call back. Antonio Mendoza had called about lab results as noted below.   Notes Recorded by Emily Filbert, RN on 04/05/2014 at 11:29 AM I left a message for the patient to call. Notes Recorded by Jettie Booze, MD on 04/01/2014 at 5:23 PM Labs stable for NOAC. Repeat in 6 months

## 2014-04-08 ENCOUNTER — Other Ambulatory Visit: Payer: Self-pay | Admitting: *Deleted

## 2014-04-08 DIAGNOSIS — I4891 Unspecified atrial fibrillation: Secondary | ICD-10-CM

## 2014-04-08 NOTE — Telephone Encounter (Signed)
Notified of lab results. 

## 2014-04-08 NOTE — Telephone Encounter (Signed)
Follow up ° ° ° ° °Returning a nurses call to get lab results °

## 2014-04-21 ENCOUNTER — Other Ambulatory Visit: Payer: Self-pay

## 2014-04-21 MED ORDER — FUROSEMIDE 40 MG PO TABS: 40.0000 mg | ORAL_TABLET | Freq: Every day | ORAL | Status: DC

## 2014-05-11 ENCOUNTER — Other Ambulatory Visit: Payer: Self-pay

## 2014-05-11 MED ORDER — METOPROLOL SUCCINATE ER 100 MG PO TB24: 100.0000 mg | ORAL_TABLET | Freq: Every day | ORAL | Status: DC

## 2014-05-27 DIAGNOSIS — E291 Testicular hypofunction: Secondary | ICD-10-CM | POA: Diagnosis not present

## 2014-05-27 DIAGNOSIS — N23 Unspecified renal colic: Secondary | ICD-10-CM | POA: Diagnosis not present

## 2014-05-27 DIAGNOSIS — N281 Cyst of kidney, acquired: Secondary | ICD-10-CM | POA: Diagnosis not present

## 2014-05-27 DIAGNOSIS — N3020 Other chronic cystitis without hematuria: Secondary | ICD-10-CM | POA: Diagnosis not present

## 2014-06-07 DIAGNOSIS — E291 Testicular hypofunction: Secondary | ICD-10-CM | POA: Diagnosis not present

## 2014-06-13 DIAGNOSIS — E291 Testicular hypofunction: Secondary | ICD-10-CM | POA: Diagnosis not present

## 2014-06-13 DIAGNOSIS — Z87448 Personal history of other diseases of urinary system: Secondary | ICD-10-CM | POA: Diagnosis not present

## 2014-06-13 DIAGNOSIS — N3020 Other chronic cystitis without hematuria: Secondary | ICD-10-CM | POA: Diagnosis not present

## 2014-07-14 ENCOUNTER — Other Ambulatory Visit: Payer: Self-pay | Admitting: Cardiology

## 2014-07-18 NOTE — Progress Notes (Signed)
Cardiology Office Note   Date:  07/19/2014   ID:  Antonio Mendoza, DOB [birth year], MRN [id number]  PCP:  Vena Austria, MD    Chief Complaint  Patient presents with  . Follow-up    OSA      History of Present Illness: Antonio Mendoza is a 81 y.o. male with a history of OSA and obesity.  He was diagnosed with mild OSA (AHI 5.24/hr) and underwent CPAP titration to 12cm H2O. He presents for followup. He is doing well with his CPAP device. He tolerates the full mask and feels the pressure is adequate. He feels rested in the am and has no daytime sleepiness.  He has had an improvement in his daytime sleepiness if he gets enough hours of sleep. He does not get much aerobic exercise.     Past Medical History  Diagnosis Date  . Sleep apnea     tried cpap , but did not tolerate   . A-fib     since 1996  . Kidney stones   . Hypogonadism male     followed by urologist   . Obesity (BMI 30-39.9) 01/17/2014    History reviewed. No pertinent past surgical history.   Current Outpatient Prescriptions  Medication Sig Dispense Refill  . ELIQUIS 5 MG TABS tablet TAKE 1 TABLET (5 MG TOTAL) BY MOUTH 2 (TWO) TIMES DAILY. 180 tablet 0  . furosemide (LASIX) 40 MG tablet Take 1 tablet (40 mg total) by mouth daily. 90 tablet 1  . lisinopril (PRINIVIL,ZESTRIL) 5 MG tablet Take 1 tablet (5 mg total) by mouth daily. 90 tablet 3  . metoprolol succinate (TOPROL-XL) 100 MG 24 hr tablet Take 1 tablet (100 mg total) by mouth daily. Take with or immediately following a meal. 90 tablet 0  . Testosterone (ANDROGEL) 40.5 MG/2.5GM (1.62%) GEL Place onto the skin.     No current facility-administered medications for this visit.    Allergies:   Review of patient's allergies indicates no known allergies.    Social History:  The patient  reports that he has quit smoking. He does not have any smokeless tobacco history on file. He reports that he drinks alcohol. He reports that he  does not use illicit drugs.   Family History:  The patient's family history includes CAD in his brother.    ROS:  Please see the history of present illness.   Otherwise, review of systems are positive for none.   All other systems are reviewed and negative.    PHYSICAL EXAM: VS:  BP 110/64 mmHg  Pulse 82  Ht 6\' 2"  (1.88 m)  Wt 250 lb 1.9 oz (113.454 kg)  BMI 32.10 kg/m2  SpO2 98% , BMI Body mass index is 32.1 kg/(m^2). GEN: Well nourished, well developed, in no acute distress HEENT: normal Neck: no JVD, carotid bruits, or masses Cardiac: RRR; no murmurs, rubs, or gallops.  trace edema  Respiratory:  clear to auscultation bilaterally, normal work of breathing GI: soft, nontender, nondistended, + BS MS: no deformity or atrophy Skin: warm and dry, no rash Neuro:  Strength and sensation are intact Psych: euthymic mood, full affect   EKG:  EKG is not ordered today.    Recent Labs: 04/01/2014: BUN 21; Creatinine 1.29; Hemoglobin 16.0; Platelets 204.0; Potassium 3.9; Sodium 141    Lipid Panel No results found for: CHOL, TRIG, HDL, CHOLHDL, VLDL, LDLCALC, LDLDIRECT  Wt Readings from Last 3 Encounters:  07/19/14 250 lb 1.9 oz (113.454 kg)  01/17/14 241 lb 12.8 oz (109.68 kg)  10/17/13 236 lb (107.049 kg)    ASSESSMENT AND PLAN: 1.  Mild OSA on CPAP and tolerating well.His d/l today showed an AHI of 4.1/hr on 12cm H2O and 100% compliant in using more than 4 hours nightly.   2. Obesity - I have encouraged him to get into a routine aerobic exercise routine to try to lose some weight.   Labs/ tests ordered today: See above Assessment and Plan No orders of the defined types were placed in this encounter.     Disposition:   FU with me in 1 year  Signed, Sueanne Margarita, MD  07/19/2014 10:01 AM    [facility] [place], [place], [place]  [address] Phone: [phone]; Fax: [phone]

## 2014-07-19 ENCOUNTER — Encounter: Payer: Self-pay | Admitting: Cardiology

## 2014-07-19 ENCOUNTER — Ambulatory Visit (INDEPENDENT_AMBULATORY_CARE_PROVIDER_SITE_OTHER): Payer: Medicare Other | Admitting: Cardiology

## 2014-07-19 VITALS — BP 110/64 | HR 82 | Ht 74.0 in | Wt 250.1 lb

## 2014-07-19 DIAGNOSIS — G4733 Obstructive sleep apnea (adult) (pediatric): Secondary | ICD-10-CM | POA: Diagnosis not present

## 2014-07-19 DIAGNOSIS — E669 Obesity, unspecified: Secondary | ICD-10-CM

## 2014-07-19 NOTE — Patient Instructions (Signed)

## 2014-07-28 ENCOUNTER — Encounter: Payer: Self-pay | Admitting: Interventional Cardiology

## 2014-07-28 ENCOUNTER — Ambulatory Visit (INDEPENDENT_AMBULATORY_CARE_PROVIDER_SITE_OTHER): Payer: Medicare Other | Admitting: Interventional Cardiology

## 2014-07-28 VITALS — BP 132/70 | HR 72 | Ht 74.0 in | Wt 249.0 lb

## 2014-07-28 DIAGNOSIS — G4733 Obstructive sleep apnea (adult) (pediatric): Secondary | ICD-10-CM

## 2014-07-28 DIAGNOSIS — R609 Edema, unspecified: Secondary | ICD-10-CM

## 2014-07-28 DIAGNOSIS — I482 Chronic atrial fibrillation, unspecified: Secondary | ICD-10-CM

## 2014-07-28 NOTE — Patient Instructions (Signed)
Medication Instructions:  None  Labwork: Keep lab appointment on 09/28/14 (CBC, BMET)  Testing/Procedures: Nonr  Follow-Up: Your physician wants you to follow-up in: 1 year with Dr. Irish Lack. You will receive a reminder letter in the mail two months in advance. If you don't receive a letter, please call our office to schedule the follow-up appointment.   Any Other Special Instructions Will Be Listed Below (If Applicable).

## 2014-07-28 NOTE — Progress Notes (Signed)
Patient ID: Antonio Mendoza, male   DOB: [birth year], 81 y.o.   MRN: [id number]     Cardiology Office Note   Date:  07/28/2014   ID:  Antonio Mendoza, DOB [birth year], MRN [id number]  PCP:  Vena Austria, MD    No chief complaint on file. AFib   Wt Readings from Last 3 Encounters:  07/28/14 249 lb (112.946 kg)  07/19/14 250 lb 1.9 oz (113.454 kg)  01/17/14 241 lb 12.8 oz (109.68 kg)       History of Present Illness: Antonio Mendoza is a 81 y.o. male  who has chronic AFib and mildly decreased LV function. He no longer walks twice a week.  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  No chest pain related to walking.  He spends 3-4 hours a day caring for his wife.  She has dementia.  Atrial Fibrillation F/U:  He feels that his balance is off.  No falls.   c/o Leg edema more at the end of the day; wears compression stockings.  c/o Shortness of breath exacerbated by activity. He has used advair in the past without much change..  Denies : Chest pain.  Dizziness while getting up from sitting position.  Orthopnea.  Palpitations.  Syncope.   Religiously wears his CPAP at night. Feels his balance is off as he gets older.  Denies dizziness, syncope, near syncope, fall, dyspnea, palpitations, chest pain, orthopnea, PND, LE edema, and stroke-like symptoms. No bleeding. Rarely checks  BP  at home, usually 115-120/70-75. Same at a store. Exercises once a week, walks for about 98min.     Past Medical History  Diagnosis Date  . Sleep apnea     tried cpap , but did not tolerate   . A-fib     since 1996  . Kidney stones   . Hypogonadism male     followed by urologist   . Obesity (BMI 30-39.9) 01/17/2014    No past surgical history on file.   Current Outpatient Prescriptions  Medication Sig Dispense Refill  . ELIQUIS 5 MG TABS tablet TAKE 1 TABLET (5 MG TOTAL) BY MOUTH 2 (TWO) TIMES DAILY. 180 tablet 0  . furosemide (LASIX) 40 MG tablet Take 1 tablet (40 mg total) by  mouth daily. 90 tablet 1  . lisinopril (PRINIVIL,ZESTRIL) 5 MG tablet Take 1 tablet (5 mg total) by mouth daily. 90 tablet 3  . metoprolol succinate (TOPROL-XL) 100 MG 24 hr tablet Take 1 tablet (100 mg total) by mouth daily. Take with or immediately following a meal. 90 tablet 0  . Testosterone (ANDROGEL) 40.5 MG/2.5GM (1.62%) GEL Place onto the skin.     No current facility-administered medications for this visit.    Allergies:   Review of patient's allergies indicates no known allergies.    Social History:  The patient  reports that he has quit smoking. He does not have any smokeless tobacco history on file. He reports that he drinks alcohol. He reports that he does not use illicit drugs.   Family History:  The patient's *family history includes CAD in his brother.    ROS:  Please see the history of present illness.   Otherwise, review of systems are positive for fatigue.   All other systems are reviewed and negative.    PHYSICAL EXAM: VS:  BP 132/70 mmHg  Pulse 72  Ht 6\' 2"  (1.88 m)  Wt 249 lb (112.946 kg)  BMI 31.96 kg/m2 , BMI Body mass  index is 31.96 kg/(m^2). GEN: Well nourished, well developed, in no acute distress HEENT: normal Neck: no JVD, carotid bruits, or masses Cardiac: irregularly irregular; no murmurs, rubs, or gallops,no edema  Respiratory:  clear to auscultation bilaterally, normal work of breathing GI: soft, nontender, nondistended, + BS MS: no deformity or atrophy Skin: warm and dry, no rash Neuro:  Strength and sensation are intact Psych: euthymic mood, full affect   EKG:   The ekg ordered today demonstrates AFib, no ST segment changes   Recent Labs: 04/01/2014: BUN 21; Creatinine, Ser 1.29; Hemoglobin 16.0; Platelets 204.0; Potassium 3.9; Sodium 141   Lipid Panel No results found for: CHOL, TRIG, HDL, CHOLHDL, VLDL, LDLCALC, LDLDIRECT   Other studies Reviewed: Additional studies/ records that were reviewed today with results demonstrating:  .   ASSESSMENT AND PLAN:  Atrial fibrillation  Continue Metoprolol Succinate Tablet Extended Release 24 Hour, 100 mg, TAKE 1 TABLET ONE TIME DAILY Continue Eliquis 5 mg Tablet, ., 1 tablet, orally, twice a day IMAGING: EKG      Notes: No sx of tachycardia.  No bleeding isues. 2. Cardiomyopathy  Continue Lisinopril Tablet, 5 MG, TAKE 1 TABLET ONE TIME DAILY Continue Furosemide Tablet, 40 MG, TAKE 1 TABLET ONE TIME DAILY Notes: EF 50-55% in the past.  3. Shortness of breath  LAB: Basic Metabolic Cr 1.3 in 9407 LAB: BNP in 81 in 2014 Notes: Better today. 4. Edema of legs  Notes: Improved with compression stockings.   5. Fatigue: Tolerating CPAP. Also has had problems getting testosterone. Wants to try Nugenix instead of the cream that is given to him by his urologist..  Checked with PharmD re : Nugenix.  It should be safe.          Current medicines are reviewed at length with the patient today.  The patient concerns regarding his medicines were addressed.  The following changes have been made:   Will try Nugenix testosterone, oral instead of the expensive cream.  It is safe.  Unclear whether it will be effective, but he does not find the cream to be effective.     Labs/ tests ordered today include: BMet, CBC for Eliquis No orders of the defined types were placed in this encounter.    Recommend 150 minutes/week of aerobic exercise Low fat, low carb, high fiber diet recommended  Disposition:   FU in 1 year   Teresita Madura., MD  07/28/2014 4:06 PM    [facility] [place], [place], [place]  [address] Phone: [phone]; Fax: [phone]

## 2014-07-29 ENCOUNTER — Encounter: Payer: Self-pay | Admitting: Cardiology

## 2014-08-17 ENCOUNTER — Other Ambulatory Visit: Payer: Self-pay | Admitting: *Deleted

## 2014-08-17 MED ORDER — METOPROLOL SUCCINATE ER 100 MG PO TB24: 100.0000 mg | ORAL_TABLET | Freq: Every day | ORAL | Status: DC

## 2014-08-31 DIAGNOSIS — E291 Testicular hypofunction: Secondary | ICD-10-CM | POA: Diagnosis not present

## 2014-09-07 DIAGNOSIS — E291 Testicular hypofunction: Secondary | ICD-10-CM | POA: Diagnosis not present

## 2014-09-07 DIAGNOSIS — N3020 Other chronic cystitis without hematuria: Secondary | ICD-10-CM | POA: Diagnosis not present

## 2014-09-07 DIAGNOSIS — N200 Calculus of kidney: Secondary | ICD-10-CM | POA: Diagnosis not present

## 2014-09-14 ENCOUNTER — Other Ambulatory Visit: Payer: Self-pay | Admitting: Interventional Cardiology

## 2014-09-28 ENCOUNTER — Other Ambulatory Visit: Payer: No Typology Code available for payment source

## 2014-09-29 ENCOUNTER — Other Ambulatory Visit (INDEPENDENT_AMBULATORY_CARE_PROVIDER_SITE_OTHER): Payer: Medicare Other | Admitting: *Deleted

## 2014-09-29 DIAGNOSIS — I4891 Unspecified atrial fibrillation: Secondary | ICD-10-CM

## 2014-09-29 LAB — CBC WITH DIFFERENTIAL/PLATELET
BASOS PCT: 0.5 % (ref 0.0–3.0)
Basophils Absolute: 0 10*3/uL (ref 0.0–0.1)
EOS PCT: 2.1 % (ref 0.0–5.0)
Eosinophils Absolute: 0.2 10*3/uL (ref 0.0–0.7)
HEMATOCRIT: 49.2 % (ref 39.0–52.0)
Hemoglobin: 16.7 g/dL (ref 13.0–17.0)
Lymphocytes Relative: 24.1 % (ref 12.0–46.0)
Lymphs Abs: 2 10*3/uL (ref 0.7–4.0)
MCHC: 34 g/dL (ref 30.0–36.0)
MCV: 94.3 fl (ref 78.0–100.0)
MONO ABS: 0.5 10*3/uL (ref 0.1–1.0)
MONOS PCT: 6.1 % (ref 3.0–12.0)
NEUTROS PCT: 67.2 % (ref 43.0–77.0)
Neutro Abs: 5.7 10*3/uL (ref 1.4–7.7)
Platelets: 192 10*3/uL (ref 150.0–400.0)
RBC: 5.21 Mil/uL (ref 4.22–5.81)
RDW: 13.9 % (ref 11.5–15.5)
WBC: 8.5 10*3/uL (ref 4.0–10.5)

## 2014-09-29 LAB — BASIC METABOLIC PANEL
BUN: 17 mg/dL (ref 6–23)
CALCIUM: 9.2 mg/dL (ref 8.4–10.5)
CO2: 25 meq/L (ref 19–32)
Chloride: 107 mEq/L (ref 96–112)
Creatinine, Ser: 1.21 mg/dL (ref 0.40–1.50)
GFR: 60.87 mL/min (ref >60.00)
GLUCOSE: 169 mg/dL — AB (ref 70–99)
Potassium: 4.1 mEq/L (ref 3.5–5.1)
SODIUM: 140 meq/L (ref 135–145)

## 2014-10-12 ENCOUNTER — Other Ambulatory Visit: Payer: Self-pay | Admitting: Interventional Cardiology

## 2014-10-12 ENCOUNTER — Other Ambulatory Visit: Payer: Self-pay

## 2014-10-12 MED ORDER — FUROSEMIDE 40 MG PO TABS: 40.0000 mg | ORAL_TABLET | Freq: Every day | ORAL | Status: DC

## 2014-10-17 ENCOUNTER — Other Ambulatory Visit: Payer: Self-pay

## 2014-10-17 MED ORDER — LISINOPRIL 5 MG PO TABS
5.0000 mg | ORAL_TABLET | Freq: Every day | ORAL | Status: DC
Start: 2014-10-17 — End: 2015-07-28

## 2014-11-08 DIAGNOSIS — E291 Testicular hypofunction: Secondary | ICD-10-CM | POA: Diagnosis not present

## 2014-11-08 DIAGNOSIS — Z1389 Encounter for screening for other disorder: Secondary | ICD-10-CM | POA: Diagnosis not present

## 2014-11-08 DIAGNOSIS — I4891 Unspecified atrial fibrillation: Secondary | ICD-10-CM | POA: Diagnosis not present

## 2014-11-08 DIAGNOSIS — N390 Urinary tract infection, site not specified: Secondary | ICD-10-CM | POA: Diagnosis not present

## 2014-11-08 DIAGNOSIS — D751 Secondary polycythemia: Secondary | ICD-10-CM | POA: Diagnosis not present

## 2014-11-08 DIAGNOSIS — Z0000 Encounter for general adult medical examination without abnormal findings: Secondary | ICD-10-CM | POA: Diagnosis not present

## 2014-11-08 DIAGNOSIS — Z23 Encounter for immunization: Secondary | ICD-10-CM | POA: Diagnosis not present

## 2014-11-08 DIAGNOSIS — I10 Essential (primary) hypertension: Secondary | ICD-10-CM | POA: Diagnosis not present

## 2014-11-25 ENCOUNTER — Other Ambulatory Visit: Payer: Self-pay

## 2014-11-25 MED ORDER — METOPROLOL SUCCINATE ER 100 MG PO TB24: 100.0000 mg | ORAL_TABLET | Freq: Every day | ORAL | Status: DC

## 2014-12-14 DIAGNOSIS — N3000 Acute cystitis without hematuria: Secondary | ICD-10-CM | POA: Diagnosis not present

## 2014-12-26 ENCOUNTER — Emergency Department (HOSPITAL_COMMUNITY)
Admission: EM | Admit: 2014-12-26 | Discharge: 2014-12-26 | Disposition: A | Discharge status: Home / Self Care | Payer: No Typology Code available for payment source | Attending: Emergency Medicine | Admitting: Emergency Medicine

## 2014-12-26 ENCOUNTER — Encounter (HOSPITAL_COMMUNITY): Payer: Self-pay

## 2014-12-26 ENCOUNTER — Emergency Department (HOSPITAL_COMMUNITY): Payer: No Typology Code available for payment source

## 2014-12-26 DIAGNOSIS — S0081XA Abrasion of other part of head, initial encounter: Secondary | ICD-10-CM | POA: Diagnosis not present

## 2014-12-26 DIAGNOSIS — Y92410 Unspecified street and highway as the place of occurrence of the external cause: Secondary | ICD-10-CM | POA: Diagnosis not present

## 2014-12-26 DIAGNOSIS — Z7901 Long term (current) use of anticoagulants: Secondary | ICD-10-CM | POA: Diagnosis not present

## 2014-12-26 DIAGNOSIS — Z87442 Personal history of urinary calculi: Secondary | ICD-10-CM | POA: Insufficient documentation

## 2014-12-26 DIAGNOSIS — Z87891 Personal history of nicotine dependence: Secondary | ICD-10-CM | POA: Diagnosis not present

## 2014-12-26 DIAGNOSIS — Y999 Unspecified external cause status: Secondary | ICD-10-CM | POA: Diagnosis not present

## 2014-12-26 DIAGNOSIS — Z79899 Other long term (current) drug therapy: Secondary | ICD-10-CM | POA: Insufficient documentation

## 2014-12-26 DIAGNOSIS — Z792 Long term (current) use of antibiotics: Secondary | ICD-10-CM | POA: Insufficient documentation

## 2014-12-26 DIAGNOSIS — Z8669 Personal history of other diseases of the nervous system and sense organs: Secondary | ICD-10-CM | POA: Diagnosis not present

## 2014-12-26 DIAGNOSIS — E669 Obesity, unspecified: Secondary | ICD-10-CM | POA: Insufficient documentation

## 2014-12-26 DIAGNOSIS — I4891 Unspecified atrial fibrillation: Secondary | ICD-10-CM | POA: Diagnosis not present

## 2014-12-26 DIAGNOSIS — S0990XA Unspecified injury of head, initial encounter: Secondary | ICD-10-CM | POA: Diagnosis not present

## 2014-12-26 DIAGNOSIS — Y9389 Activity, other specified: Secondary | ICD-10-CM | POA: Diagnosis not present

## 2014-12-26 DIAGNOSIS — Z23 Encounter for immunization: Secondary | ICD-10-CM | POA: Diagnosis not present

## 2014-12-26 MED ORDER — TETANUS-DIPHTH-ACELL PERTUSSIS 5-2.5-18.5 LF-MCG/0.5 IM SUSP
0.5000 mL | Freq: Once | INTRAMUSCULAR | Status: AC
  Administered 2014-12-26: 0.5 mL via INTRAMUSCULAR
  Filled 2014-12-26: qty 0.5
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
## 2014-12-27 DIAGNOSIS — H2512 Age-related nuclear cataract, left eye: Secondary | ICD-10-CM | POA: Diagnosis not present

## 2014-12-27 DIAGNOSIS — Z961 Presence of intraocular lens: Secondary | ICD-10-CM | POA: Diagnosis not present

## 2014-12-30 ENCOUNTER — Other Ambulatory Visit: Payer: Self-pay | Admitting: Family Medicine

## 2014-12-30 ENCOUNTER — Ambulatory Visit
Admission: RE | Admit: 2014-12-30 | Discharge: 2014-12-30 | Disposition: A | Discharge status: Home / Self Care | Payer: Medicare Other | Source: Ambulatory Visit | Attending: Family Medicine | Admitting: Family Medicine

## 2014-12-30 DIAGNOSIS — M25521 Pain in right elbow: Secondary | ICD-10-CM

## 2014-12-30 DIAGNOSIS — S59901A Unspecified injury of right elbow, initial encounter: Secondary | ICD-10-CM | POA: Diagnosis not present

## 2015-01-16 DIAGNOSIS — H35371 Puckering of macula, right eye: Secondary | ICD-10-CM | POA: Diagnosis not present

## 2015-01-16 DIAGNOSIS — H2512 Age-related nuclear cataract, left eye: Secondary | ICD-10-CM | POA: Diagnosis not present

## 2015-01-16 DIAGNOSIS — H25042 Posterior subcapsular polar age-related cataract, left eye: Secondary | ICD-10-CM | POA: Diagnosis not present

## 2015-01-16 DIAGNOSIS — H25012 Cortical age-related cataract, left eye: Secondary | ICD-10-CM | POA: Diagnosis not present

## 2015-01-24 ENCOUNTER — Encounter (INDEPENDENT_AMBULATORY_CARE_PROVIDER_SITE_OTHER): Payer: Medicare Other | Admitting: Ophthalmology

## 2015-01-25 ENCOUNTER — Other Ambulatory Visit: Payer: Self-pay | Admitting: Cardiology

## 2015-02-01 ENCOUNTER — Encounter (INDEPENDENT_AMBULATORY_CARE_PROVIDER_SITE_OTHER): Payer: Medicare Other | Admitting: Ophthalmology

## 2015-02-01 DIAGNOSIS — H2512 Age-related nuclear cataract, left eye: Secondary | ICD-10-CM | POA: Diagnosis not present

## 2015-02-01 DIAGNOSIS — H35373 Puckering of macula, bilateral: Secondary | ICD-10-CM

## 2015-02-01 DIAGNOSIS — H43813 Vitreous degeneration, bilateral: Secondary | ICD-10-CM | POA: Diagnosis not present

## 2015-02-14 DIAGNOSIS — H2512 Age-related nuclear cataract, left eye: Secondary | ICD-10-CM | POA: Diagnosis not present

## 2015-02-21 DIAGNOSIS — E291 Testicular hypofunction: Secondary | ICD-10-CM | POA: Diagnosis not present

## 2015-02-21 DIAGNOSIS — N400 Enlarged prostate without lower urinary tract symptoms: Secondary | ICD-10-CM | POA: Diagnosis not present

## 2015-02-22 DIAGNOSIS — C44229 Squamous cell carcinoma of skin of left ear and external auricular canal: Secondary | ICD-10-CM | POA: Diagnosis not present

## 2015-04-03 DIAGNOSIS — E291 Testicular hypofunction: Secondary | ICD-10-CM | POA: Diagnosis not present

## 2015-04-03 DIAGNOSIS — N400 Enlarged prostate without lower urinary tract symptoms: Secondary | ICD-10-CM | POA: Diagnosis not present

## 2015-04-03 DIAGNOSIS — R8271 Bacteriuria: Secondary | ICD-10-CM | POA: Diagnosis not present

## 2015-04-03 DIAGNOSIS — B9620 Unspecified Escherichia coli [E. coli] as the cause of diseases classified elsewhere: Secondary | ICD-10-CM | POA: Diagnosis not present

## 2015-04-03 DIAGNOSIS — Z0000 Encounter for general adult medical examination without abnormal findings: Secondary | ICD-10-CM | POA: Diagnosis not present

## 2015-04-03 DIAGNOSIS — N390 Urinary tract infection, site not specified: Secondary | ICD-10-CM | POA: Diagnosis not present

## 2015-04-03 DIAGNOSIS — N3020 Other chronic cystitis without hematuria: Secondary | ICD-10-CM | POA: Diagnosis not present

## 2015-04-03 DIAGNOSIS — N200 Calculus of kidney: Secondary | ICD-10-CM | POA: Diagnosis not present

## 2015-06-05 ENCOUNTER — Other Ambulatory Visit: Payer: Self-pay | Admitting: Interventional Cardiology

## 2015-06-06 DIAGNOSIS — L821 Other seborrheic keratosis: Secondary | ICD-10-CM | POA: Diagnosis not present

## 2015-06-06 DIAGNOSIS — Z85828 Personal history of other malignant neoplasm of skin: Secondary | ICD-10-CM | POA: Diagnosis not present

## 2015-06-07 ENCOUNTER — Ambulatory Visit (INDEPENDENT_AMBULATORY_CARE_PROVIDER_SITE_OTHER): Payer: Medicare Other | Admitting: Ophthalmology

## 2015-06-07 DIAGNOSIS — H43813 Vitreous degeneration, bilateral: Secondary | ICD-10-CM

## 2015-06-07 DIAGNOSIS — H35373 Puckering of macula, bilateral: Secondary | ICD-10-CM

## 2015-06-13 ENCOUNTER — Other Ambulatory Visit: Payer: Self-pay | Admitting: Interventional Cardiology

## 2015-07-13 ENCOUNTER — Encounter: Payer: Self-pay | Admitting: Cardiology

## 2015-07-19 ENCOUNTER — Encounter: Payer: Self-pay | Admitting: Cardiology

## 2015-07-19 ENCOUNTER — Ambulatory Visit (INDEPENDENT_AMBULATORY_CARE_PROVIDER_SITE_OTHER): Payer: Medicare Other | Admitting: Cardiology

## 2015-07-19 VITALS — BP 138/80 | HR 80 | Ht 74.0 in | Wt 246.4 lb

## 2015-07-19 DIAGNOSIS — G4733 Obstructive sleep apnea (adult) (pediatric): Secondary | ICD-10-CM

## 2015-07-19 DIAGNOSIS — E669 Obesity, unspecified: Secondary | ICD-10-CM

## 2015-07-19 NOTE — Progress Notes (Signed)
Cardiology Office Note    Date:  07/19/2015   ID:  RODRICUS Mendoza, DOB Jul 03, 1929, MRN [id number]  PCP:  Vena Austria, MD  Cardiologist:  Fransico Him, MD   Chief Complaint  Patient presents with  . Follow-up    SLEEP  . Sleep Apnea    History of Present Illness:  Antonio Mendoza is a 82 y.o. male with a history of OSA and obesity.  He was diagnosed with mild OSA (AHI 5.24/hr) and is on CPAP at 12cm H2O. He presents for followup. He is doing well with his CPAP device. He tolerates the full mask and feels the pressure is adequate. He feels rested in the am but has some daytime sleepiness is he goes to bed too late.He does not snore.  He denies any mouth dryness of nasal congestion.  Past Medical History  Diagnosis Date  . Sleep apnea     tried cpap , but did not tolerate   . A-fib ([place])     since 1996  . Kidney stones   . Hypogonadism male     followed by urologist   . Obesity (BMI 30-39.9) 01/17/2014    No past surgical history on file.  Current Medications: Outpatient Prescriptions Prior to Visit  Medication Sig Dispense Refill  . ELIQUIS 5 MG TABS tablet TAKE 1 TABLET (5 MG TOTAL) BY MOUTH 2 (TWO) TIMES DAILY. 180 tablet 0  . furosemide (LASIX) 40 MG tablet Take 1 tablet (40 mg total) by mouth daily. 90 tablet 3  . lisinopril (PRINIVIL,ZESTRIL) 5 MG tablet Take 1 tablet (5 mg total) by mouth daily. 90 tablet 3  . metoprolol succinate (TOPROL-XL) 100 MG 24 hr tablet Take 1 tablet (100 mg total) by mouth daily. Take with or immediately following a meal. 90 tablet 3  . amoxicillin (AMOXIL) 500 MG capsule Take 500 mg by mouth 3 (three) times daily. Reported on 07/19/2015  0   No facility-administered medications prior to visit.     Allergies:   Nitrofuran derivatives   Social History   Social History  . Marital Status: Married    Spouse Name: N/A  . Number of Children: N/A  . Years of Education: N/A   Social History Main Topics  . Smoking status:  Former Research scientist (life sciences)  . Smokeless tobacco: None  . Alcohol Use: Yes  . Drug Use: No  . Sexual Activity: Not Asked   Other Topics Concern  . None   Social History Narrative     Family History:  The patient's family history includes CAD in his brother.   ROS:   Please see the history of present illness.    ROS All other systems reviewed and are negative.   PHYSICAL EXAM:   VS:  BP 138/80 mmHg  Pulse 80  Ht 6\' 2"  (1.88 m)  Wt 246 lb 6.4 oz (111.766 kg)  BMI 31.62 kg/m2   GEN: Well nourished, well developed, in no acute distress HEENT: normal Neck: no JVD, carotid bruits, or masses Cardiac: RRR; no murmurs, rubs, or gallops,no edema.  Intact distal pulses bilaterally.  Respiratory:  clear to auscultation bilaterally, normal work of breathing GI: soft, nontender, nondistended, + BS MS: no deformity or atrophy Skin: warm and dry, no rash Neuro:  Alert and Oriented x 3, Strength and sensation are intact Psych: euthymic mood, full affect  Wt Readings from Last 3 Encounters:  07/19/15 246 lb 6.4 oz (111.766 kg)  12/26/14 240 lb (108.863 kg)  07/28/14 249  lb (112.946 kg)      Studies/Labs Reviewed:   EKG:  EKG is not ordered today.   Recent Labs: 09/29/2014: BUN 17; Creatinine, Ser 1.21; Hemoglobin 16.7; Platelets 192.0; Potassium 4.1; Sodium 140   Lipid Panel No results found for: CHOL, TRIG, HDL, CHOLHDL, VLDL, LDLCALC, LDLDIRECT  Additional studies/ records that were reviewed today include:  none    ASSESSMENT:    1. OSA (obstructive sleep apnea)   2. Obesity (BMI 30-39.9)      PLAN:  In order of problems listed above:  OSA - the patient is tolerating PAP therapy well without any problems. The PAP download was reviewed today and showed an AHI of 4.9/hr on 12 cm H2O with 100% compliance in using more than 4 hours nightly.  The patient has been using and benefiting from CPAP use and will continue to benefit from therapy.  Obesity - I have encouraged him to get into  a routine exercise program and cut back on carbs and portions.      Medication Adjustments/Labs and Tests Ordered: Current medicines are reviewed at length with the patient today.  Concerns regarding medicines are outlined above.  Medication changes, Labs and Tests ordered today are listed in the Patient Instructions below.  There are no Patient Instructions on file for this visit.   Signed, Fransico Him, MD  07/19/2015 9:23 AM    [facility], [place], [place]  [address] Phone: [phone]; Fax: [phone]

## 2015-07-19 NOTE — Patient Instructions (Signed)

## 2015-07-28 ENCOUNTER — Other Ambulatory Visit: Payer: Self-pay | Admitting: Interventional Cardiology

## 2015-07-30 NOTE — Progress Notes (Signed)
Cardiology Office Note   Date:  07/30/2015   ID:  Antonio Mendoza, DOB Jul 13, 1929, MRN [id number]  PCP:  Antonio Austria, MD    No chief complaint on file. f/u AFib   Wt Readings from Last 3 Encounters:  07/19/15 246 lb 6.4 oz (111.766 kg)  12/26/14 240 lb (108.863 kg)  07/28/14 249 lb (112.946 kg)       History of Present Illness: Antonio Mendoza is a 82 y.o. male  who has chronic AFib and mildly decreased LV function. He no longer walks twice a week. 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 No chest pain related to walking.His wife has dementia and is now in a nursing home.  Atrial Fibrillation F/U:  He feels that his balance is off.  No falls. No bleeding problems.  c/o Leg edema more at the end of the day; wears compression stockings.  c/o Shortness of breath exacerbated by activity. He has used advair in the past without much change.  Does not exercise much to improve stamina.  Denies : Chest pain.  Dizziness while getting up from sitting position.  Orthopnea.  Palpitations.  Syncope.   Religiously wears his CPAP at night. Feels his balance is off as he gets older. Denies dizziness, syncope, near syncope, fall, dyspnea, palpitations, chest pain, orthopnea, PND, LE edema, and stroke-like symptoms. No bleeding. Rarely checks BP at store, usually 110-120/70-75.   No blood in stool.    He feels that it is more difficult to swallow recently. Typically worse in the morning, also 3-4 x/month.     Past Medical History  Diagnosis Date  . Sleep apnea     tried cpap , but did not tolerate   . A-fib ([place])     since 1996  . Kidney stones   . Hypogonadism male     followed by urologist   . Obesity (BMI 30-39.9) 01/17/2014    No past surgical history on file.   Current Outpatient Prescriptions  Medication Sig Dispense Refill  . ELIQUIS 5 MG TABS tablet TAKE 1 TABLET (5 MG TOTAL) BY MOUTH 2 (TWO) TIMES DAILY. 180 tablet 0  . furosemide  (LASIX) 40 MG tablet Take 1 tablet (40 mg total) by mouth daily. 90 tablet 3  . lisinopril (PRINIVIL,ZESTRIL) 5 MG tablet Take 1 tablet (5 mg total) by mouth daily. 90 tablet 3  . metoprolol succinate (TOPROL-XL) 100 MG 24 hr tablet Take 1 tablet (100 mg total) by mouth daily. Take with or immediately following a meal. 90 tablet 3   No current facility-administered medications for this visit.    Allergies:   Nitrofuran derivatives    Social History:  The patient  reports that he has quit smoking. He does not have any smokeless tobacco history on file. He reports that he drinks alcohol. He reports that he does not use illicit drugs.   Family History:  The patient's family history includes CAD in his brother.    ROS:  Please see the history of present illness.   Otherwise, review of systems are positive for occasional choking sensation when eating..   All other systems are reviewed and negative.    PHYSICAL EXAM: VS:  There were no vitals taken for this visit. , BMI There is no weight on file to calculate BMI. GEN: Well nourished, well developed, in no acute distress HEENT: normal Neck: no JVD, carotid bruits, or masses Cardiac: RRR; no murmurs, rubs, or gallops,no edema  Respiratory:  clear to auscultation bilaterally, normal work of breathing GI: soft, nontender, nondistended, + BS MS: no deformity or atrophy Skin: warm and dry, no rash Neuro:  Strength and sensation are intact Psych: euthymic mood, full affect   EKG:   The ekg ordered today demonstrates AFib, rate controlled   Recent Labs: 09/29/2014: BUN 17; Creatinine, Ser 1.21; Hemoglobin 16.7; Platelets 192.0; Potassium 4.1; Sodium 140   Lipid Panel No results found for: CHOL, TRIG, HDL, CHOLHDL, VLDL, LDLCALC, LDLDIRECT   Other studies Reviewed: Additional studies/ records that were reviewed today with results demonstrating: echo results.   ASSESSMENT AND PLAN:  1. AFib: rate controlled.  Eliquis for stroke  prevention. 2. Cardiomyopathy: EF improved to 50-55% level. 3. Shortness of breath:  Stable.  He does not do much exercise. He skeet shoots.  4. Leg edema: stable with compression stockings 5. May need a swallowing assessment to see what diet he would best tolerate.  SOunds like he may be aspirating intermittently the way he describes choking on food.   Current medicines are reviewed at length with the patient today.  The patient concerns regarding his medicines were addressed.  The following changes have been made:  No change  Labs/ tests ordered today include:  No orders of the defined types were placed in this encounter.    Recommend 150 minutes/week of aerobic exercise Low fat, low carb, high fiber diet recommended  Disposition:   FU in 1 year   Signed, Antonio Grooms, MD  07/30/2015 11:16 PM    [facility] [place], [place], [place]  [address] Phone: [phone]; Fax: [phone]

## 2015-07-31 ENCOUNTER — Encounter: Payer: Self-pay | Admitting: Interventional Cardiology

## 2015-07-31 ENCOUNTER — Ambulatory Visit (INDEPENDENT_AMBULATORY_CARE_PROVIDER_SITE_OTHER): Payer: Medicare Other | Admitting: Interventional Cardiology

## 2015-07-31 VITALS — BP 110/70 | HR 71 | Ht 74.0 in | Wt 246.0 lb

## 2015-07-31 DIAGNOSIS — R1310 Dysphagia, unspecified: Secondary | ICD-10-CM

## 2015-07-31 DIAGNOSIS — G4733 Obstructive sleep apnea (adult) (pediatric): Secondary | ICD-10-CM | POA: Diagnosis not present

## 2015-07-31 DIAGNOSIS — I482 Chronic atrial fibrillation, unspecified: Secondary | ICD-10-CM

## 2015-07-31 DIAGNOSIS — E669 Obesity, unspecified: Secondary | ICD-10-CM

## 2015-07-31 NOTE — Patient Instructions (Signed)
Medication Instructions:  Same-no changes  Labwork: BMET in 2 weeks-08/14/15 anytime between 7:30 AM until 5:00 PM.  Testing/Procedures: None  Follow-Up: Your physician wants you to follow-up in: 1 year.  You will receive a reminder letter in the mail two months in advance. If you don't receive a letter, please call our office to schedule the follow-up appointment.     If you need a refill on your cardiac medications before your next appointment, please call your pharmacy.

## 2015-08-14 ENCOUNTER — Other Ambulatory Visit (INDEPENDENT_AMBULATORY_CARE_PROVIDER_SITE_OTHER): Payer: Medicare Other | Admitting: *Deleted

## 2015-08-14 DIAGNOSIS — I482 Chronic atrial fibrillation, unspecified: Secondary | ICD-10-CM

## 2015-08-15 LAB — BASIC METABOLIC PANEL
BUN: 17 mg/dL (ref 7–25)
CHLORIDE: 105 mmol/L (ref 98–110)
CO2: 25 mmol/L (ref 20–31)
Calcium: 8.8 mg/dL (ref 8.6–10.3)
Creat: 1.18 mg/dL — ABNORMAL HIGH (ref 0.70–1.11)
Glucose, Bld: 108 mg/dL — ABNORMAL HIGH (ref 65–99)
Potassium: 3.8 mmol/L (ref 3.5–5.3)
SODIUM: 141 mmol/L (ref 135–146)

## 2015-09-04 DIAGNOSIS — H26491 Other secondary cataract, right eye: Secondary | ICD-10-CM | POA: Diagnosis not present

## 2015-09-04 DIAGNOSIS — H43813 Vitreous degeneration, bilateral: Secondary | ICD-10-CM | POA: Diagnosis not present

## 2015-09-04 DIAGNOSIS — Z961 Presence of intraocular lens: Secondary | ICD-10-CM | POA: Diagnosis not present

## 2015-09-04 DIAGNOSIS — Z9889 Other specified postprocedural states: Secondary | ICD-10-CM | POA: Diagnosis not present

## 2015-09-05 DIAGNOSIS — L905 Scar conditions and fibrosis of skin: Secondary | ICD-10-CM | POA: Diagnosis not present

## 2015-11-06 ENCOUNTER — Other Ambulatory Visit: Payer: Self-pay | Admitting: Interventional Cardiology

## 2015-11-24 ENCOUNTER — Other Ambulatory Visit: Payer: Self-pay | Admitting: Interventional Cardiology

## 2015-12-14 ENCOUNTER — Ambulatory Visit (INDEPENDENT_AMBULATORY_CARE_PROVIDER_SITE_OTHER): Payer: Medicare Other | Admitting: Ophthalmology

## 2016-03-29 DIAGNOSIS — N3020 Other chronic cystitis without hematuria: Secondary | ICD-10-CM | POA: Diagnosis not present

## 2016-03-29 DIAGNOSIS — Z125 Encounter for screening for malignant neoplasm of prostate: Secondary | ICD-10-CM | POA: Diagnosis not present

## 2016-04-04 DIAGNOSIS — N3020 Other chronic cystitis without hematuria: Secondary | ICD-10-CM | POA: Diagnosis not present

## 2016-04-04 DIAGNOSIS — Z87442 Personal history of urinary calculi: Secondary | ICD-10-CM | POA: Diagnosis not present

## 2016-04-04 DIAGNOSIS — E291 Testicular hypofunction: Secondary | ICD-10-CM | POA: Diagnosis not present

## 2016-06-23 ENCOUNTER — Encounter: Payer: Self-pay | Admitting: Cardiology

## 2016-06-26 ENCOUNTER — Ambulatory Visit (INDEPENDENT_AMBULATORY_CARE_PROVIDER_SITE_OTHER): Payer: Medicare Other | Admitting: Cardiology

## 2016-06-26 ENCOUNTER — Encounter: Payer: Self-pay | Admitting: Cardiology

## 2016-06-26 ENCOUNTER — Encounter (INDEPENDENT_AMBULATORY_CARE_PROVIDER_SITE_OTHER): Payer: Self-pay

## 2016-06-26 VITALS — BP 130/82 | HR 81 | Ht 74.0 in | Wt 249.5 lb

## 2016-06-26 DIAGNOSIS — E669 Obesity, unspecified: Secondary | ICD-10-CM | POA: Diagnosis not present

## 2016-06-26 DIAGNOSIS — I4821 Permanent atrial fibrillation: Secondary | ICD-10-CM

## 2016-06-26 DIAGNOSIS — G4733 Obstructive sleep apnea (adult) (pediatric): Secondary | ICD-10-CM

## 2016-06-26 DIAGNOSIS — I482 Chronic atrial fibrillation: Secondary | ICD-10-CM | POA: Diagnosis not present

## 2016-06-26 NOTE — Patient Instructions (Signed)

## 2016-06-26 NOTE — Progress Notes (Signed)
Cardiology Office Note    Date:  06/26/2016   ID:  Antonio Mendoza, DOB [birth year], MRN [id number]  PCP:  Maury Dus, MD  Cardiologist:  Fransico Him, MD   Chief Complaint  Patient presents with  . Sleep Apnea    History of Present Illness:  Antonio Mendoza is a 83 y.o. male with a history of OSA and obesity.  He was diagnosed with mild OSA (AHI 5.24/hr) and is on CPAP at 12cm H2O. He presents today for followup and is doing well. He tolerates the full mask and feels the pressure is adequate. He feels rested in the am but still has some daytime sleepiness if he goes to bed too late.He does not snore.  Occasionally he will have some mouth dryness of nasal congestion.  He does not get any formal aerobic exercise.   Past Medical History:  Diagnosis Date  . A-fib ([place])    since 1996  . Hypogonadism male    followed by urologist   . Kidney stones   . Obesity (BMI 30-39.9) 01/17/2014  . OSA (obstructive sleep apnea)    on CPAP    No past surgical history on file.  Current Medications: Current Meds  Medication Sig  . ELIQUIS 5 MG TABS tablet TAKE 1 TABLET TWICE DAILY  . furosemide (LASIX) 40 MG tablet Take 1 tablet (40 mg total) by mouth daily.  Marland Kitchen lisinopril (PRINIVIL,ZESTRIL) 5 MG tablet Take 1 tablet (5 mg total) by mouth daily.  . metoprolol succinate (TOPROL-XL) 100 MG 24 hr tablet TAKE 1 TABLET BY MOUTH EVERY DAY WITH FOOD OR IMMEDIATELY FOLLOWING A MEAL    Allergies:   Nitrofuran derivatives   Social History   Social History  . Marital status: Married    Spouse name: N/A  . Number of children: N/A  . Years of education: N/A   Social History Main Topics  . Smoking status: Former Research scientist (life sciences)  . Smokeless tobacco: Never Used  . Alcohol use Yes  . Drug use: No  . Sexual activity: Not Asked   Other Topics Concern  . None   Social History Narrative  . None     Family History:  The patient's family history includes CAD in his brother; Heart attack in his  mother.   ROS:   Please see the history of present illness.    ROS All other systems reviewed and are negative.  No flowsheet data found.     PHYSICAL EXAM:   VS:  BP 130/82   Pulse 81   Ht 6\' 2"  (1.88 m)   Wt 249 lb 8 oz (113.2 kg)   SpO2 97%   BMI 32.03 kg/m    GEN: Well nourished, well developed, in no acute distress  HEENT: normal  Neck: no JVD, carotid bruits, or masses Cardiac: irregularly irregular; no murmurs, rubs, or gallops,no edema.  Intact distal pulses bilaterally.  Respiratory:  clear to auscultation bilaterally, normal work of breathing GI: soft, nontender, nondistended, + BS MS: no deformity or atrophy  Skin: warm and dry, no rash Neuro:  Alert and Oriented x 3, Strength and sensation are intact Psych: euthymic mood, full affect  Wt Readings from Last 3 Encounters:  06/26/16 249 lb 8 oz (113.2 kg)  07/31/15 246 lb (111.6 kg)  07/19/15 246 lb 6.4 oz (111.8 kg)      Studies/Labs Reviewed:   EKG:  EKG is not ordered today.   Recent Labs: 08/14/2015: BUN 17; Creat 1.18; Potassium 3.8;  Sodium 141   Lipid Panel No results found for: CHOL, TRIG, HDL, CHOLHDL, VLDL, LDLCALC, LDLDIRECT  Additional studies/ records that were reviewed today include:  CPAP download    ASSESSMENT:    1. OSA (obstructive sleep apnea)   2. Obesity (BMI 30-39.9)   3. Permanent atrial fibrillation ([facility])      PLAN:  In order of problems listed above:  OSA - the patient is tolerating PAP therapy well without any problems. The PAP download was reviewed today and showed an AHI of 7.4/hr on 12 cm H2O with 97% compliance in using more than 4 hours nightly.  The patient has been using and benefiting from CPAP use and will continue to benefit from therapy. It appears that his mask is leaking significantly. I have asked him to take his mask to [facility] to look at the fit and then repeat a download in 4 weeks. Obesity - I have encouraged him to get into a routine exercise program and  cut back on carbs and portions.  Permanent atrial fibrillation - this is rate controlled and he is asymptomatic.  Continue Eliquis and BB.   Medication Adjustments/Labs and Tests Ordered: Current medicines are reviewed at length with the patient today.  Concerns regarding medicines are outlined above.  Medication changes, Labs and Tests ordered today are listed in the Patient Instructions below.  There are no Patient Instructions on file for this visit.   Signed, Fransico Him, MD  06/26/2016 9:48 AM    [facility] [place] [place], [place], [place]  [address] Phone: [phone]; Fax: [phone]

## 2016-06-28 ENCOUNTER — Telehealth: Payer: Self-pay | Admitting: *Deleted

## 2016-06-28 NOTE — Telephone Encounter (Signed)
-----   Message from Theodoro Parma, RN sent at 06/26/2016 10:01 AM EDT ----- Regarding: download  Please put on the calendar to pull a download in 4 weeks  Thanks!!

## 2016-07-30 ENCOUNTER — Encounter: Payer: Self-pay | Admitting: Interventional Cardiology

## 2016-07-30 ENCOUNTER — Ambulatory Visit (INDEPENDENT_AMBULATORY_CARE_PROVIDER_SITE_OTHER): Payer: Medicare Other | Admitting: Interventional Cardiology

## 2016-07-30 ENCOUNTER — Encounter (INDEPENDENT_AMBULATORY_CARE_PROVIDER_SITE_OTHER): Payer: Self-pay

## 2016-07-30 VITALS — BP 112/68 | HR 90 | Ht 74.0 in | Wt 243.8 lb

## 2016-07-30 DIAGNOSIS — R1310 Dysphagia, unspecified: Secondary | ICD-10-CM | POA: Diagnosis not present

## 2016-07-30 DIAGNOSIS — E785 Hyperlipidemia, unspecified: Secondary | ICD-10-CM

## 2016-07-30 DIAGNOSIS — I4821 Permanent atrial fibrillation: Secondary | ICD-10-CM

## 2016-07-30 DIAGNOSIS — E669 Obesity, unspecified: Secondary | ICD-10-CM

## 2016-07-30 DIAGNOSIS — I482 Chronic atrial fibrillation: Secondary | ICD-10-CM

## 2016-07-30 DIAGNOSIS — R600 Localized edema: Secondary | ICD-10-CM | POA: Insufficient documentation

## 2016-07-30 HISTORY — DX: Localized edema: R60.0

## 2016-07-30 NOTE — Telephone Encounter (Signed)
Download printed sent to scan
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
## 2016-07-30 NOTE — Patient Instructions (Signed)
Medication Instructions:  Your physician recommends that you continue on your current medications as directed. Please refer to the Current Medication list given to you today.   Labwork: Your physician recommends that you return for lab work for FASTING Lipids, CMET, and CBC.   Testing/Procedures: None ordered  Follow-Up: Your physician wants you to follow-up in: 1 year with Dr. Irish Lack. You will receive a reminder letter in the mail two months in advance. If you don't receive a letter, please call our office to schedule the follow-up appointment.   Any Other Special Instructions Will Be Listed Below (If Applicable).     If you need a refill on your cardiac medications before your next appointment, please call your pharmacy.

## 2016-07-31 ENCOUNTER — Other Ambulatory Visit: Payer: Medicare Other | Admitting: *Deleted

## 2016-07-31 ENCOUNTER — Telehealth: Payer: Self-pay | Admitting: *Deleted

## 2016-07-31 DIAGNOSIS — I4821 Permanent atrial fibrillation: Secondary | ICD-10-CM

## 2016-07-31 DIAGNOSIS — I482 Chronic atrial fibrillation: Secondary | ICD-10-CM | POA: Diagnosis not present

## 2016-07-31 DIAGNOSIS — E785 Hyperlipidemia, unspecified: Secondary | ICD-10-CM

## 2016-07-31 NOTE — Telephone Encounter (Signed)
Informed patient of compliance results and patient understanding was verbalized. Patient understands his current settings will not change.

## 2016-07-31 NOTE — Telephone Encounter (Signed)
-----   Message from Sueanne Margarita, MD sent at 07/31/2016 12:34 PM EDT ----- Good AHI and compliance.  Continue current CPAP settings.

## 2016-08-01 LAB — CBC
Hematocrit: 44.4 % (ref 37.5–51.0)
Hemoglobin: 15.1 g/dL (ref 13.0–17.7)
MCH: 32.3 pg (ref 26.6–33.0)
MCHC: 34 g/dL (ref 31.5–35.7)
MCV: 95 fL (ref 79–97)
PLATELETS: 227 10*3/uL (ref 150–379)
RBC: 4.67 x10E6/uL (ref 4.14–5.80)
RDW: 14.2 % (ref 12.3–15.4)
WBC: 7.8 10*3/uL (ref 3.4–10.8)

## 2016-08-01 LAB — COMPREHENSIVE METABOLIC PANEL
A/G RATIO: 1.4 (ref 1.2–2.2)
ALK PHOS: 73 IU/L (ref 39–117)
ALT: 19 IU/L (ref 0–44)
AST: 22 IU/L (ref 0–40)
Albumin: 3.7 g/dL (ref 3.5–4.7)
BILIRUBIN TOTAL: 0.7 mg/dL (ref 0.0–1.2)
BUN/Creatinine Ratio: 18 (ref 10–24)
BUN: 21 mg/dL (ref 8–27)
CHLORIDE: 105 mmol/L (ref 96–106)
CO2: 23 mmol/L (ref 20–29)
Calcium: 9.3 mg/dL (ref 8.6–10.2)
Creatinine, Ser: 1.16 mg/dL (ref 0.76–1.27)
GFR calc non Af Amer: 58 mL/min/{1.73_m2} — ABNORMAL LOW (ref >59)
GFR, EST AFRICAN AMERICAN: 66 mL/min/{1.73_m2} (ref >59)
Globulin, Total: 2.7 g/dL (ref 1.5–4.5)
Glucose: 111 mg/dL — ABNORMAL HIGH (ref 65–99)
POTASSIUM: 4.1 mmol/L (ref 3.5–5.2)
Sodium: 141 mmol/L (ref 134–144)
Total Protein: 6.4 g/dL (ref 6.0–8.5)

## 2016-08-01 LAB — LIPID PANEL
Chol/HDL Ratio: 3.8 ratio (ref 0.0–5.0)
Cholesterol, Total: 150 mg/dL (ref 100–199)
HDL: 39 mg/dL — ABNORMAL LOW (ref >39)
LDL CALC: 91 mg/dL (ref 0–99)
Triglycerides: 99 mg/dL (ref 0–149)
VLDL CHOLESTEROL CAL: 20 mg/dL (ref 5–40)

## 2016-08-05 ENCOUNTER — Other Ambulatory Visit: Payer: Self-pay | Admitting: Interventional Cardiology

## 2016-08-06 NOTE — Telephone Encounter (Signed)
Pt saw Dr Irish Lack on 07/30/16. SCr from 07/31/16 was 110.6Kg. Pt is 83 yrs old. Eliquis 5mg  BID reordered.

## 2016-08-19 ENCOUNTER — Other Ambulatory Visit: Payer: Self-pay | Admitting: Interventional Cardiology

## 2016-08-30 ENCOUNTER — Other Ambulatory Visit: Payer: Self-pay | Admitting: Interventional Cardiology

## 2016-09-03 ENCOUNTER — Other Ambulatory Visit: Payer: Self-pay | Admitting: Dermatology

## 2016-09-03 DIAGNOSIS — D485 Neoplasm of uncertain behavior of skin: Secondary | ICD-10-CM | POA: Diagnosis not present

## 2016-09-03 DIAGNOSIS — L570 Actinic keratosis: Secondary | ICD-10-CM | POA: Diagnosis not present

## 2016-09-03 DIAGNOSIS — D225 Melanocytic nevi of trunk: Secondary | ICD-10-CM | POA: Diagnosis not present

## 2016-09-03 DIAGNOSIS — Z85828 Personal history of other malignant neoplasm of skin: Secondary | ICD-10-CM | POA: Diagnosis not present

## 2016-09-04 DIAGNOSIS — H353131 Nonexudative age-related macular degeneration, bilateral, early dry stage: Secondary | ICD-10-CM | POA: Diagnosis not present

## 2016-09-11 ENCOUNTER — Other Ambulatory Visit: Payer: Self-pay | Admitting: Dermatology

## 2016-09-11 DIAGNOSIS — D485 Neoplasm of uncertain behavior of skin: Secondary | ICD-10-CM | POA: Diagnosis not present

## 2016-09-11 DIAGNOSIS — L988 Other specified disorders of the skin and subcutaneous tissue: Secondary | ICD-10-CM | POA: Diagnosis not present

## 2016-10-07 ENCOUNTER — Other Ambulatory Visit: Payer: Self-pay | Admitting: Interventional Cardiology

## 2016-12-06 DIAGNOSIS — Z23 Encounter for immunization: Secondary | ICD-10-CM | POA: Diagnosis not present

## 2017-02-13 DIAGNOSIS — Z125 Encounter for screening for malignant neoplasm of prostate: Secondary | ICD-10-CM | POA: Diagnosis not present

## 2017-02-13 DIAGNOSIS — R7303 Prediabetes: Secondary | ICD-10-CM | POA: Diagnosis not present

## 2017-02-13 DIAGNOSIS — E669 Obesity, unspecified: Secondary | ICD-10-CM | POA: Diagnosis not present

## 2017-02-13 DIAGNOSIS — D751 Secondary polycythemia: Secondary | ICD-10-CM | POA: Diagnosis not present

## 2017-02-13 DIAGNOSIS — Z0000 Encounter for general adult medical examination without abnormal findings: Secondary | ICD-10-CM | POA: Diagnosis not present

## 2017-02-13 DIAGNOSIS — Z1389 Encounter for screening for other disorder: Secondary | ICD-10-CM | POA: Diagnosis not present

## 2017-02-13 DIAGNOSIS — N183 Chronic kidney disease, stage 3 (moderate): Secondary | ICD-10-CM | POA: Diagnosis not present

## 2017-02-13 DIAGNOSIS — Z6833 Body mass index (BMI) 33.0-33.9, adult: Secondary | ICD-10-CM | POA: Diagnosis not present

## 2017-02-13 DIAGNOSIS — I428 Other cardiomyopathies: Secondary | ICD-10-CM | POA: Diagnosis not present

## 2017-02-13 DIAGNOSIS — I4891 Unspecified atrial fibrillation: Secondary | ICD-10-CM | POA: Diagnosis not present

## 2017-02-13 DIAGNOSIS — I10 Essential (primary) hypertension: Secondary | ICD-10-CM | POA: Diagnosis not present

## 2017-03-17 ENCOUNTER — Telehealth: Payer: Self-pay | Admitting: Interventional Cardiology

## 2017-03-17 MED ORDER — METOPROLOL SUCCINATE ER 100 MG PO TB24: ORAL_TABLET | ORAL | 1 refills | Status: DC

## 2017-03-17 MED ORDER — LISINOPRIL 5 MG PO TABS: 5.0000 mg | ORAL_TABLET | Freq: Every day | ORAL | 1 refills | Status: DC

## 2017-03-17 MED ORDER — FUROSEMIDE 40 MG PO TABS: 40.0000 mg | ORAL_TABLET | Freq: Every day | ORAL | 1 refills | Status: DC

## 2017-03-17 MED ORDER — APIXABAN 5 MG PO TABS: 5.0000 mg | ORAL_TABLET | Freq: Two times a day (BID) | ORAL | 3 refills | Status: DC

## 2017-03-17 NOTE — Telephone Encounter (Signed)
Pt needs his eliquis sent to his new pharmacy [facility]. Please address

## 2017-03-17 NOTE — Telephone Encounter (Signed)
Eliquis sent to [facility] as requested

## 2017-04-14 DIAGNOSIS — Z87442 Personal history of urinary calculi: Secondary | ICD-10-CM | POA: Diagnosis not present

## 2017-04-14 DIAGNOSIS — R310 Gross hematuria: Secondary | ICD-10-CM | POA: Diagnosis not present

## 2017-04-14 DIAGNOSIS — N5201 Erectile dysfunction due to arterial insufficiency: Secondary | ICD-10-CM | POA: Diagnosis not present

## 2017-04-14 DIAGNOSIS — N3020 Other chronic cystitis without hematuria: Secondary | ICD-10-CM | POA: Diagnosis not present

## 2017-04-21 DIAGNOSIS — N200 Calculus of kidney: Secondary | ICD-10-CM | POA: Diagnosis not present

## 2017-04-21 DIAGNOSIS — R310 Gross hematuria: Secondary | ICD-10-CM | POA: Diagnosis not present

## 2017-05-07 DIAGNOSIS — N3020 Other chronic cystitis without hematuria: Secondary | ICD-10-CM | POA: Diagnosis not present

## 2017-05-07 DIAGNOSIS — N35011 Post-traumatic bulbous urethral stricture: Secondary | ICD-10-CM | POA: Diagnosis not present

## 2017-05-07 DIAGNOSIS — N5201 Erectile dysfunction due to arterial insufficiency: Secondary | ICD-10-CM | POA: Diagnosis not present

## 2017-05-07 DIAGNOSIS — N200 Calculus of kidney: Secondary | ICD-10-CM | POA: Diagnosis not present

## 2017-08-05 ENCOUNTER — Telehealth: Payer: Self-pay | Admitting: *Deleted

## 2017-08-05 ENCOUNTER — Encounter: Payer: Self-pay | Admitting: Cardiology

## 2017-08-05 ENCOUNTER — Ambulatory Visit (INDEPENDENT_AMBULATORY_CARE_PROVIDER_SITE_OTHER): Payer: Medicare Other | Admitting: Cardiology

## 2017-08-05 VITALS — BP 126/69 | HR 89 | Ht 74.0 in | Wt 251.4 lb

## 2017-08-05 DIAGNOSIS — I482 Chronic atrial fibrillation: Secondary | ICD-10-CM

## 2017-08-05 DIAGNOSIS — E669 Obesity, unspecified: Secondary | ICD-10-CM

## 2017-08-05 DIAGNOSIS — G4733 Obstructive sleep apnea (adult) (pediatric): Secondary | ICD-10-CM

## 2017-08-05 DIAGNOSIS — I4821 Permanent atrial fibrillation: Secondary | ICD-10-CM

## 2017-08-05 NOTE — Telephone Encounter (Signed)
Faxed to [facility] today

## 2017-08-05 NOTE — Progress Notes (Signed)
Cardiology Office Note:    Date:  08/05/2017   ID:  Antonio Mendoza, DOB 1929/07/06, MRN [id number]  PCP:  Antonio Dus, MD  Cardiologist:  No primary care provider on file.    Referring MD: Antonio Dus, MD   Chief Complaint  Patient presents with  . Sleep Apnea    History of Present Illness:    Antonio Mendoza is a 84 y.o. male with a hx of mild OSA (AHI 5.24/hr) and is on CPAP at 12cm H2O. He is doing well with his CPAP device.  He tolerates the mask and feels the pressure is adequate.  Since going on CPAP he feels rested in the am and has no significant daytime sleepiness.  He denies any significant mouth or nasal dryness or nasal congestion.  He does not think that he snores.     Past Medical History:  Diagnosis Date  . Bilateral lower extremity edema 07/30/2016  . Edema 07/26/2013  . Hypogonadism male    followed by urologist   . Kidney stones   . Obesity (BMI 30-39.9) 01/17/2014  . OSA (obstructive sleep apnea)    on CPAP  . Other primary cardiomyopathies 07/26/2013  . Permanent atrial fibrillation ([place])     History reviewed. No pertinent surgical history.  Current Medications: Current Meds  Medication Sig  . apixaban (ELIQUIS) 5 MG TABS tablet Take 1 tablet (5 mg total) by mouth 2 (two) times daily.  . furosemide (LASIX) 40 MG tablet Take 1 tablet (40 mg total) by mouth daily.  Marland Kitchen lisinopril (PRINIVIL,ZESTRIL) 5 MG tablet Take 1 tablet (5 mg total) by mouth daily.  . metoprolol succinate (TOPROL-XL) 100 MG 24 hr tablet TAKE 1 TABLET BY MOUTH EVERY DAY WITH FOOD OR IMMEDIATELY FOLLOWING A MEAL     Allergies:   Nitrofuran derivatives   Social History   Socioeconomic History  . Marital status: Married    Spouse name: Not on file  . Number of children: Not on file  . Years of education: Not on file  . Highest education level: Not on file  Occupational History  . Not on file  Social Needs  . Financial resource strain: Not on file  . Food insecurity:    Worry:  Not on file    Inability: Not on file  . Transportation needs:    Medical: Not on file    Non-medical: Not on file  Tobacco Use  . Smoking status: Former Research scientist (life sciences)  . Smokeless tobacco: Never Used  Substance and Sexual Activity  . Alcohol use: Yes  . Drug use: No  . Sexual activity: Not on file  Lifestyle  . Physical activity:    Days per week: Not on file    Minutes per session: Not on file  . Stress: Not on file  Relationships  . Social connections:    Talks on phone: Not on file    Gets together: Not on file    Attends religious service: Not on file    Active member of club or organization: Not on file    Attends meetings of clubs or organizations: Not on file    Relationship status: Not on file  Other Topics Concern  . Not on file  Social History Narrative  . Not on file     Family History: The patient's family history includes CAD in his brother; Heart attack in his mother. There is no history of Hypertension or Stroke.  ROS:   Please see the history of  present illness.    ROS  All other systems reviewed and negative.   EKGs/Labs/Other Studies Reviewed:    The following studies were reviewed today: PAP download  EKG:  EKG is not ordered today.    Recent Labs: No results found for requested labs within last 8760 hours.   Recent Lipid Panel    Component Value Date/Time   CHOL 150 07/31/2016 1104   TRIG 99 07/31/2016 1104   HDL 39 (L) 07/31/2016 1104   CHOLHDL 3.8 07/31/2016 1104   LDLCALC 91 07/31/2016 1104    Physical Exam:    VS:  BP 126/69   Pulse 89   Ht 6\' 2"  (1.88 m)   Wt 251 lb 6.4 oz (114 kg)   SpO2 96%   BMI 32.28 kg/m     Wt Readings from Last 3 Encounters:  08/05/17 251 lb 6.4 oz (114 kg)  07/30/16 243 lb 12.8 oz (110.6 kg)  06/26/16 249 lb 8 oz (113.2 kg)     GEN:  Well nourished, well developed in no acute distress HEENT: Normal NECK: No JVD; No carotid bruits LYMPHATICS: No lymphadenopathy CARDIAC: Regularly irregular, no  murmurs, rubs, gallops RESPIRATORY:  Clear to auscultation without rales, wheezing or rhonchi  ABDOMEN: Soft, non-tender, non-distended MUSCULOSKELETAL:  No edema; No deformity  SKIN: Warm and dry NEUROLOGIC:  Alert and oriented x 3 PSYCHIATRIC:  Normal affect   ASSESSMENT:    1. OSA (obstructive sleep apnea)   2. Obesity (BMI 30-39.9)   3. Permanent atrial fibrillation ([facility])    PLAN:    In order of problems listed above:  1.  OSA - the patient is tolerating PAP therapy well without any problems. The PAP download was reviewed today and showed an AHI of 6.9/hr on 12 cm H2O with 93% compliance in using more than 4 hours nightly.  The patient has been using and benefiting from PAP use and will continue to benefit from therapy. It appears he has a significant mask leak so I will order him a new mask and repeat a download in 1 month.   2.  Obesity - I have encouraged him to get into a routine exercise program and cut back on carbs and portions.   3.  Permanent atrial fibrillation -heart rate is well controlled today.  Continue Toprol-XL 100 mg daily and apixaban.  Creatinine was normal at 1 on 04/14/2017 and hemoglobin stable at 16 on 02/13/2017.   Medication Adjustments/Labs and Tests Ordered: Current medicines are reviewed at length with the patient today.  Concerns regarding medicines are outlined above.  No orders of the defined types were placed in this encounter.  No orders of the defined types were placed in this encounter.   Signed, Fransico Him, MD  08/05/2017 10:17 AM    [place]

## 2017-08-05 NOTE — Telephone Encounter (Signed)
-----   Message from Teressa Senter, RN sent at 08/05/2017 10:22 AM EDT ----- Regarding: dme order  dme order placed   [place]

## 2017-08-05 NOTE — Patient Instructions (Signed)
Medication Instructions:  Your physician recommends that you continue on your current medications as directed. Please refer to the Current Medication list given to you today.  If you need a refill on your cardiac medications, please contact your pharmacy first.  Labwork: None ordered   Testing/Procedures: None ordered   Follow-Up: Your physician wants you to follow-up in: 1 year with Dr. Turner. You will receive a reminder letter in the mail two months in advance. If you don't receive a letter, please call our office to schedule the follow-up appointment.  Any Other Special Instructions Will Be Listed Below (If Applicable).   Thank you for choosing [facility]    Rena Sissy Goetzke, RN  [phone]  If you need a refill on your cardiac medications before your next appointment, please call your pharmacy.   
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
## 2017-08-08 ENCOUNTER — Ambulatory Visit (INDEPENDENT_AMBULATORY_CARE_PROVIDER_SITE_OTHER): Payer: Medicare Other | Admitting: Interventional Cardiology

## 2017-08-08 ENCOUNTER — Encounter: Payer: Self-pay | Admitting: Interventional Cardiology

## 2017-08-08 VITALS — BP 132/78 | HR 79 | Ht 74.0 in | Wt 249.0 lb

## 2017-08-08 DIAGNOSIS — E785 Hyperlipidemia, unspecified: Secondary | ICD-10-CM

## 2017-08-08 DIAGNOSIS — I4821 Permanent atrial fibrillation: Secondary | ICD-10-CM

## 2017-08-08 DIAGNOSIS — R600 Localized edema: Secondary | ICD-10-CM

## 2017-08-08 DIAGNOSIS — I482 Chronic atrial fibrillation: Secondary | ICD-10-CM

## 2017-08-08 DIAGNOSIS — Z7901 Long term (current) use of anticoagulants: Secondary | ICD-10-CM | POA: Diagnosis not present

## 2017-08-08 NOTE — Patient Instructions (Signed)
Medication Instructions:  Your physician recommends that you continue on your current medications as directed. Please refer to the Current Medication list given to you today.   Labwork: TODAY: CBC, BMET  Testing/Procedures: None ordered  Follow-Up: Your physician wants you to follow-up in: 1 year with Dr. Irish Lack. You will receive a reminder letter in the mail two months in advance. If you don't receive a letter, please call our office to schedule the follow-up appointment.   Any Other Special Instructions Will Be Listed Below (If Applicable).     If you need a refill on your cardiac medications before your next appointment, please call your pharmacy.

## 2017-08-09 LAB — CBC
HEMATOCRIT: 44 % (ref 37.5–51.0)
HEMOGLOBIN: 15.1 g/dL (ref 13.0–17.7)
MCH: 31.9 pg (ref 26.6–33.0)
MCHC: 34.3 g/dL (ref 31.5–35.7)
MCV: 93 fL (ref 79–97)
Platelets: 235 10*3/uL (ref 150–450)
RBC: 4.73 x10E6/uL (ref 4.14–5.80)
RDW: 14.8 % (ref 12.3–15.4)
WBC: 9.1 10*3/uL (ref 3.4–10.8)

## 2017-08-09 LAB — BASIC METABOLIC PANEL
BUN/Creatinine Ratio: 19 (ref 10–24)
BUN: 21 mg/dL (ref 8–27)
CALCIUM: 9.3 mg/dL (ref 8.6–10.2)
CHLORIDE: 106 mmol/L (ref 96–106)
CO2: 24 mmol/L (ref 20–29)
CREATININE: 1.12 mg/dL (ref 0.76–1.27)
GFR calc non Af Amer: 60 mL/min/{1.73_m2} (ref >59)
GFR, EST AFRICAN AMERICAN: 69 mL/min/{1.73_m2} (ref >59)
Glucose: 105 mg/dL — ABNORMAL HIGH (ref 65–99)
Potassium: 3.9 mmol/L (ref 3.5–5.2)
Sodium: 142 mmol/L (ref 134–144)

## 2017-09-03 DIAGNOSIS — D1801 Hemangioma of skin and subcutaneous tissue: Secondary | ICD-10-CM | POA: Diagnosis not present

## 2017-09-03 DIAGNOSIS — L821 Other seborrheic keratosis: Secondary | ICD-10-CM | POA: Diagnosis not present

## 2017-09-03 DIAGNOSIS — Z85828 Personal history of other malignant neoplasm of skin: Secondary | ICD-10-CM | POA: Diagnosis not present

## 2017-09-03 DIAGNOSIS — L819 Disorder of pigmentation, unspecified: Secondary | ICD-10-CM | POA: Diagnosis not present

## 2017-09-03 DIAGNOSIS — L570 Actinic keratosis: Secondary | ICD-10-CM | POA: Diagnosis not present

## 2017-09-08 ENCOUNTER — Other Ambulatory Visit: Payer: Self-pay | Admitting: Interventional Cardiology

## 2017-11-05 DIAGNOSIS — N3020 Other chronic cystitis without hematuria: Secondary | ICD-10-CM | POA: Diagnosis not present

## 2017-11-05 DIAGNOSIS — N5201 Erectile dysfunction due to arterial insufficiency: Secondary | ICD-10-CM | POA: Diagnosis not present

## 2017-11-05 DIAGNOSIS — N200 Calculus of kidney: Secondary | ICD-10-CM | POA: Diagnosis not present

## 2017-11-09 ENCOUNTER — Other Ambulatory Visit: Payer: Self-pay | Admitting: Interventional Cardiology

## 2018-02-16 DIAGNOSIS — I10 Essential (primary) hypertension: Secondary | ICD-10-CM | POA: Diagnosis not present

## 2018-02-16 DIAGNOSIS — N390 Urinary tract infection, site not specified: Secondary | ICD-10-CM | POA: Diagnosis not present

## 2018-02-16 DIAGNOSIS — N183 Chronic kidney disease, stage 3 (moderate): Secondary | ICD-10-CM | POA: Diagnosis not present

## 2018-02-16 DIAGNOSIS — Z1389 Encounter for screening for other disorder: Secondary | ICD-10-CM | POA: Diagnosis not present

## 2018-02-16 DIAGNOSIS — I428 Other cardiomyopathies: Secondary | ICD-10-CM | POA: Diagnosis not present

## 2018-02-16 DIAGNOSIS — R7303 Prediabetes: Secondary | ICD-10-CM | POA: Diagnosis not present

## 2018-02-16 DIAGNOSIS — Z23 Encounter for immunization: Secondary | ICD-10-CM | POA: Diagnosis not present

## 2018-02-16 DIAGNOSIS — E669 Obesity, unspecified: Secondary | ICD-10-CM | POA: Diagnosis not present

## 2018-02-16 DIAGNOSIS — Z13220 Encounter for screening for lipoid disorders: Secondary | ICD-10-CM | POA: Diagnosis not present

## 2018-02-16 DIAGNOSIS — Z6833 Body mass index (BMI) 33.0-33.9, adult: Secondary | ICD-10-CM | POA: Diagnosis not present

## 2018-02-16 DIAGNOSIS — Z0000 Encounter for general adult medical examination without abnormal findings: Secondary | ICD-10-CM | POA: Diagnosis not present

## 2018-02-16 DIAGNOSIS — I4891 Unspecified atrial fibrillation: Secondary | ICD-10-CM | POA: Diagnosis not present

## 2018-02-16 DIAGNOSIS — D751 Secondary polycythemia: Secondary | ICD-10-CM | POA: Diagnosis not present

## 2018-03-06 DIAGNOSIS — Z872 Personal history of diseases of the skin and subcutaneous tissue: Secondary | ICD-10-CM | POA: Diagnosis not present

## 2018-03-06 DIAGNOSIS — L814 Other melanin hyperpigmentation: Secondary | ICD-10-CM | POA: Diagnosis not present

## 2018-03-06 DIAGNOSIS — D2372 Other benign neoplasm of skin of left lower limb, including hip: Secondary | ICD-10-CM | POA: Diagnosis not present

## 2018-03-06 DIAGNOSIS — I8312 Varicose veins of left lower extremity with inflammation: Secondary | ICD-10-CM | POA: Diagnosis not present

## 2018-03-06 DIAGNOSIS — D1801 Hemangioma of skin and subcutaneous tissue: Secondary | ICD-10-CM | POA: Diagnosis not present

## 2018-03-06 DIAGNOSIS — I8311 Varicose veins of right lower extremity with inflammation: Secondary | ICD-10-CM | POA: Diagnosis not present

## 2018-03-06 DIAGNOSIS — Z85828 Personal history of other malignant neoplasm of skin: Secondary | ICD-10-CM | POA: Diagnosis not present

## 2018-03-06 DIAGNOSIS — D485 Neoplasm of uncertain behavior of skin: Secondary | ICD-10-CM | POA: Diagnosis not present

## 2018-03-06 DIAGNOSIS — D229 Melanocytic nevi, unspecified: Secondary | ICD-10-CM | POA: Diagnosis not present

## 2018-03-06 DIAGNOSIS — L821 Other seborrheic keratosis: Secondary | ICD-10-CM | POA: Diagnosis not present

## 2018-03-06 DIAGNOSIS — L249 Irritant contact dermatitis, unspecified cause: Secondary | ICD-10-CM | POA: Diagnosis not present

## 2018-04-18 ENCOUNTER — Other Ambulatory Visit: Payer: Self-pay | Admitting: Interventional Cardiology

## 2018-08-01 ENCOUNTER — Encounter (HOSPITAL_COMMUNITY): Payer: Self-pay | Admitting: Emergency Medicine

## 2018-08-01 ENCOUNTER — Emergency Department (HOSPITAL_BASED_OUTPATIENT_CLINIC_OR_DEPARTMENT_OTHER): Payer: Medicare Other

## 2018-08-01 ENCOUNTER — Other Ambulatory Visit: Payer: Self-pay

## 2018-08-01 ENCOUNTER — Emergency Department (HOSPITAL_COMMUNITY)
Admission: EM | Admit: 2018-08-01 | Discharge: 2018-08-01 | Disposition: A | Discharge status: Home / Self Care | Payer: Medicare Other | Attending: Emergency Medicine | Admitting: Emergency Medicine

## 2018-08-01 ENCOUNTER — Emergency Department (HOSPITAL_COMMUNITY): Payer: Medicare Other

## 2018-08-01 DIAGNOSIS — Z79899 Other long term (current) drug therapy: Secondary | ICD-10-CM | POA: Diagnosis not present

## 2018-08-01 DIAGNOSIS — R2241 Localized swelling, mass and lump, right lower limb: Secondary | ICD-10-CM | POA: Diagnosis present

## 2018-08-01 DIAGNOSIS — M7989 Other specified soft tissue disorders: Secondary | ICD-10-CM

## 2018-08-01 DIAGNOSIS — L538 Other specified erythematous conditions: Secondary | ICD-10-CM | POA: Diagnosis not present

## 2018-08-01 DIAGNOSIS — L03115 Cellulitis of right lower limb: Secondary | ICD-10-CM | POA: Diagnosis not present

## 2018-08-01 DIAGNOSIS — Z7901 Long term (current) use of anticoagulants: Secondary | ICD-10-CM | POA: Insufficient documentation

## 2018-08-01 DIAGNOSIS — Z87891 Personal history of nicotine dependence: Secondary | ICD-10-CM | POA: Insufficient documentation

## 2018-08-01 DIAGNOSIS — R609 Edema, unspecified: Secondary | ICD-10-CM | POA: Diagnosis not present

## 2018-08-01 LAB — CBC WITH DIFFERENTIAL/PLATELET
Abs Immature Granulocytes: 0.02 10*3/uL (ref 0.00–0.07)
Basophils Absolute: 0.1 10*3/uL (ref 0.0–0.1)
Basophils Relative: 1 %
Eosinophils Absolute: 0.2 10*3/uL (ref 0.0–0.5)
Eosinophils Relative: 2 %
HCT: 45.4 % (ref 39.0–52.0)
Hemoglobin: 14.7 g/dL (ref 13.0–17.0)
Immature Granulocytes: 0 %
Lymphocytes Relative: 26 %
Lymphs Abs: 2.1 10*3/uL (ref 0.7–4.0)
MCH: 31.8 pg (ref 26.0–34.0)
MCHC: 32.4 g/dL (ref 30.0–36.0)
MCV: 98.3 fL (ref 80.0–100.0)
Monocytes Absolute: 0.7 10*3/uL (ref 0.1–1.0)
Monocytes Relative: 9 %
Neutro Abs: 4.9 10*3/uL (ref 1.7–7.7)
Neutrophils Relative %: 62 %
Platelets: 231 10*3/uL (ref 150–400)
RBC: 4.62 MIL/uL (ref 4.22–5.81)
RDW: 13.6 % (ref 11.5–15.5)
WBC: 7.9 10*3/uL (ref 4.0–10.5)
nRBC: 0 % (ref 0.0–0.2)

## 2018-08-01 LAB — PROTIME-INR
INR: 1.2 (ref 0.8–1.2)
Prothrombin Time: 14.9 seconds (ref 11.4–15.2)

## 2018-08-01 LAB — BASIC METABOLIC PANEL
Anion gap: 8 (ref 5–15)
BUN: 15 mg/dL (ref 8–23)
CO2: 24 mmol/L (ref 22–32)
Calcium: 8.7 mg/dL — ABNORMAL LOW (ref 8.9–10.3)
Chloride: 109 mmol/L (ref 98–111)
Creatinine, Ser: 1.16 mg/dL (ref 0.61–1.24)
GFR calc Af Amer: >60 mL/min (ref >60)
GFR calc non Af Amer: 57 mL/min — ABNORMAL LOW (ref >60)
Glucose, Bld: 103 mg/dL — ABNORMAL HIGH (ref 70–99)
Potassium: 3.4 mmol/L — ABNORMAL LOW (ref 3.5–5.1)
Sodium: 141 mmol/L (ref 135–145)

## 2018-08-01 MED ORDER — CLINDAMYCIN HCL 300 MG PO CAPS
300.0000 mg | ORAL_CAPSULE | Freq: Once | ORAL | Status: AC
  Administered 2018-08-01: 300 mg via ORAL
  Filled 2018-08-01: qty 1

## 2018-08-01 MED ORDER — CLINDAMYCIN HCL 300 MG PO CAPS: 300.0000 mg | ORAL_CAPSULE | Freq: Three times a day (TID) | ORAL | 0 refills | Status: AC

## 2018-08-01 NOTE — Discharge Instructions (Signed)
Evaluated today for leg swelling and redness. Likely cellulitis. [place] negative for blood clot. Follow up with PCP in 2 days for wound recheck  Return if fever, chills, wound, worsening redness, streaking going up the leg

## 2018-08-01 NOTE — Progress Notes (Signed)
VASCULAR LAB PRELIMINARY  PRELIMINARY  PRELIMINARY  PRELIMINARY  Right lower extremity venous duplex completed.    Preliminary report:  See CV proc for preliminary results.  Gave Dr. Wilson Singer results.  Ladell Bey, RVT 08/01/2018, 7:28 PM

## 2018-08-01 NOTE — ED Triage Notes (Signed)
Pt reports 2 sundays ago was out in [place] and thought he had just got sunburned on RLE. Pt has swelling and erythema to RLE.

## 2018-08-01 NOTE — ED Provider Notes (Signed)
[facility] DEPT Provider Note   CSN: [id number] Arrival date & time: 08/01/18  1657  History   Chief Complaint Chief Complaint  Patient presents with  . Leg Swelling   HPI Antonio Mendoza is a 85 y.o. male with past medical history significant for bilateral lower extremity edema, obesity, OSA, Persistent Afib on Eliquis who presents for evaluation of lower extremity swelling. Redness and swelling to RLE which began on Monday. He was out in a field shooting skeet when he belives his leg had just become sun burned from last weekend. Pain a 3/10 and is worse when he touches the skin on his leg. Has been ambulatory without difficulty. No tenderness to bilateral posterior calves. Denies fever, chills, nausea, vomiting, chest pain, SOB, hemoptysis, abd pain, diarrhea, dysuria, recent falls, injuries, decreased ROM to extremities, numbness and tingling. Denies any additional aggravating or alleviating factors. Has not taken any antibiotics in the last 6 months. Denies prior history of cellulitis. Has not missed doses of his anticoagulation.  History obtained from patient. No interpretor was used.    HPI  Past Medical History:  Diagnosis Date  . Bilateral lower extremity edema 07/30/2016  . Edema 07/26/2013  . Hypogonadism male    followed by urologist   . Kidney stones   . Obesity (BMI 30-39.9) 01/17/2014  . OSA (obstructive sleep apnea)    on CPAP  . Other primary cardiomyopathies 07/26/2013  . Permanent atrial fibrillation     Patient Active Problem List   Diagnosis Date Noted  . Bilateral lower extremity edema 07/30/2016  . Swallowing difficulty 07/30/2016  . Obesity (BMI 30-39.9) 01/17/2014  . Other primary cardiomyopathies 07/26/2013  . OSA (obstructive sleep apnea) 07/26/2013  . Edema 07/26/2013  . Permanent atrial fibrillation ([place])    History reviewed. No pertinent surgical history.   Home Medications    Prior to Admission medications    Medication Sig Start Date End Date Taking? Authorizing Provider  clindamycin (CLEOCIN) 300 MG capsule Take 1 capsule (300 mg total) by mouth 3 (three) times daily for 7 days. 08/01/18 08/08/18  Ramere Downs A, PA-C  ELIQUIS 5 MG TABS tablet TAKE 1 TABLET BY MOUTH TWICE A DAY 04/20/18   Sueanne Margarita, MD  furosemide (LASIX) 40 MG tablet TAKE 1 TABLET BY MOUTH EVERY DAY 09/08/17   Jettie Booze, MD  lisinopril (PRINIVIL,ZESTRIL) 5 MG tablet TAKE 1 TABLET BY MOUTH EVERY DAY 09/08/17   Jettie Booze, MD  metoprolol succinate (TOPROL-XL) 100 MG 24 hr tablet TAKE 1 TABLET BY MOUTH EVERY DAY WITH FOOD OR IMMEDIATELY FOLLOWING A MEAL 11/10/17   Jettie Booze, MD   Family History Family History  Problem Relation Age of Onset  . CAD Brother   . Heart attack Mother   . Hypertension Neg Hx   . Stroke Neg Hx     Social History Social History   Tobacco Use  . Smoking status: Former Research scientist (life sciences)  . Smokeless tobacco: Never Used  Substance Use Topics  . Alcohol use: Yes  . Drug use: No     Allergies   Nitrofuran derivatives   Review of Systems Review of Systems  Constitutional: Negative.   HENT: Negative.   Respiratory: Negative.   Cardiovascular: Negative.   Gastrointestinal: Negative.   Genitourinary: Negative.   Musculoskeletal: Negative for arthralgias, back pain, gait problem, joint swelling, myalgias, neck pain and neck stiffness.       Right leg swelling, and redness.  Skin: Negative.  Neurological: Negative.   All other systems reviewed and are negative.  Physical Exam Updated Vital Signs BP (!) 148/90 (BP Location: Right Arm)   Pulse 77   Temp 98 F (36.7 C) (Oral)   Resp 18   SpO2 98%   Physical Exam Vitals signs and nursing note reviewed.  Constitutional:      General: He is not in acute distress.    Appearance: He is well-developed. He is not ill-appearing, toxic-appearing or diaphoretic.  HENT:     Head: Normocephalic and atraumatic.      Nose: Nose normal.     Mouth/Throat:     Mouth: Mucous membranes are moist.     Pharynx: Oropharynx is clear.  Eyes:     Pupils: Pupils are equal, round, and reactive to light.  Neck:     Musculoskeletal: Normal range of motion and neck supple.  Cardiovascular:     Rate and Rhythm: Normal rate and regular rhythm.     Pulses: Normal pulses.          Dorsalis pedis pulses are 2+ on the right side and 2+ on the left side.       Posterior tibial pulses are 2+ on the right side and 2+ on the left side.     Heart sounds: Normal heart sounds. No murmur. No friction rub. No gallop.   Pulmonary:     Effort: Pulmonary effort is normal. No respiratory distress.     Breath sounds: Normal breath sounds. No stridor. No wheezing, rhonchi or rales.  Chest:     Chest wall: No tenderness.  Abdominal:     General: Bowel sounds are normal. There is no distension.     Palpations: Abdomen is soft.     Tenderness: There is no abdominal tenderness. There is no right CVA tenderness, left CVA tenderness, guarding or rebound.  Musculoskeletal: Normal range of motion.     Right knee: Normal.     Left knee: Normal.     Right ankle: Normal.     Left ankle: Normal.     Right lower leg: He exhibits tenderness and swelling. He exhibits no bony tenderness, no deformity and no laceration. Edema present.     Left lower leg: Normal.     Right foot: Normal.     Left foot: Normal.     Comments: Full ROM to bilateral lower extremities without difficulty. Full ROM to joints without pain. No tenderness to posterior bilateral calves. Tenderness to palpation to anterior surface over right tib/fib. No bony tenderness. 1+ edema to LLE, 2+ edema to LLE.  Skin:    General: Skin is warm and dry.  Neurological:     Mental Status: He is alert.       ED Treatments / Results  Labs (all labs ordered are listed, but only abnormal results are displayed) Labs Reviewed  BASIC METABOLIC PANEL - Abnormal; Notable for the  following components:      Result Value   Potassium 3.4 (*)    Glucose, Bld 103 (*)    Calcium 8.7 (*)    GFR calc non Af Amer 57 (*)    All other components within normal limits  CBC WITH DIFFERENTIAL/PLATELET  PROTIME-INR    EKG None  Radiology Dg Tibia/fibula Right  Result Date: 08/01/2018 CLINICAL DATA:  85 y.o male reports 2 sundays ago was out in [place] and thought he had just got sunburned on RLE. Pt has swelling and erythema to RLE. Nondiabetic.  Pt is on blood thinners. EXAM: RIGHT TIBIA AND FIBULA - 2 VIEW COMPARISON:  None. FINDINGS: No fracture or bone lesion. Knee and ankle joints are normally spaced and aligned. There is nonspecific diffuse subcutaneous soft tissue edema. No soft tissue air. Scattered phleboliths are noted. IMPRESSION: 1. No fracture or bone lesion. 2. Nonspecific subcutaneous soft tissue edema. Electronically Signed   By: Lajean Manes M.D.   On: 08/01/2018 18:02   Dg Foot Complete Right  Result Date: 08/01/2018 CLINICAL DATA:  Swelling and redness to RIGHT lower extremity EXAM: RIGHT FOOT COMPLETE - 3+ VIEW COMPARISON:  None FINDINGS: Osseous mineralization normal. Minimal narrowing of first MTP joint and scattered IP joints. Remain joint spaces preserved. No acute fracture, dislocation, or bone destruction. Mild soft tissue swelling at dorsum of foot. Small soft tissue calcification at dorsum of foot at the level of the metatarsal heads. IMPRESSION: No acute osseous abnormalities. Electronically Signed   By: Lavonia Dana M.D.   On: 08/01/2018 18:03   Vas [place] Lower Extremity Venous (dvt) (only [facility])  Result Date: 08/01/2018  Lower Venous Study Indications: Swelling, and Erythema. Other Indications: Cellulitis. Comparison Study: No prior study on file for comparison Performing Technologist: Sharion Dove RVS  Examination Guidelines: A complete evaluation includes B-mode imaging, spectral Doppler, color Doppler, and power Doppler as needed of all  accessible portions of each vessel. Bilateral testing is considered an integral part of a complete examination. Limited examinations for reoccurring indications may be performed as noted.  +---------+---------------+---------+-----------+----------+-------+ RIGHT    CompressibilityPhasicitySpontaneityPropertiesSummary +---------+---------------+---------+-----------+----------+-------+ CFV      Full           Yes      Yes                          +---------+---------------+---------+-----------+----------+-------+ SFJ      Full                                                 +---------+---------------+---------+-----------+----------+-------+ FV Prox  Full                                                 +---------+---------------+---------+-----------+----------+-------+ FV Mid   Full                                                 +---------+---------------+---------+-----------+----------+-------+ FV DistalFull                                                 +---------+---------------+---------+-----------+----------+-------+ PFV      Full                                                 +---------+---------------+---------+-----------+----------+-------+ POP      Full  Yes      Yes                          +---------+---------------+---------+-----------+----------+-------+ PTV      Full                                                 +---------+---------------+---------+-----------+----------+-------+ PERO     Full                                                 +---------+---------------+---------+-----------+----------+-------+   +----+---------------+---------+-----------+----------+-------+ LEFTCompressibilityPhasicitySpontaneityPropertiesSummary +----+---------------+---------+-----------+----------+-------+ CFV Full           Yes      Yes                           +----+---------------+---------+-----------+----------+-------+     Summary: Right: There is no evidence of deep vein thrombosis in the lower extremity. Left: No evidence of common femoral vein obstruction.  *See table(s) above for measurements and observations.    Preliminary     Procedures Procedures (including critical care time)  Medications Ordered in ED Medications  clindamycin (CLEOCIN) capsule 300 mg (has no administration in time range)   Initial Impression / Assessment and Plan / ED Course  I have reviewed the triage vital signs and the nursing notes.  Pertinent labs & imaging results that were available during my care of the patient were reviewed by me and considered in my medical decision making (see chart for details).  85 year old male appears otherwise well presents for evaluation of right lower extremity swelling and redness.  Afebrile, nonseptic, non-ill-appearing.  Symptoms began on Monday. Patient states he was skeet shooting on Saturday and Sunday and noticed the redness after this. He assumed this was a sunburn however is only located to the anterior surface of his right lower extremity.  Redness, swelling and warmth from midfoot to about 2 inches below his right knee. He has full range of motion without difficulty.  No pain with flexion or extension of his joints, low suspicion for septic joint, gout or any joint involvement.  No tenderness to posterior bilateral calves however does have history of chronic atrial fibrillation currently on Eliquis. He has not missed any doses.  Denies any systemic symptoms.  No chest pain, shortness of breath or hemoptysis.  Heart and lungs clear. Neurovascularly intact.  2+ DP, PT pulses bilaterally.  Plain film right tib-fib and foot with nonspecific soft tissue swelling.  Plain film right tib/tb without fracture, dislocation, osteomyelitis,  gas-forming organism. Will obtain base labs, [place] to r/o additional clot and reevaluate.   Labs and  imaging personally reviewed: [place] negative for DVT. CBC without leukocytosis INR 1.2-- Has follow up with PCP on Monday Metabolic panel with hypokalemia at 3.4, GFR 57 at baseline  No fever, tachycardia, tachypnea or hypoxia. Non- septic appearing. No recent use of steroids or other immunosuppressive medications; no Hx of diabetes and CBG is 103 today.  Pt is without gross abscess for which I&D would be possible.  Area marked and pt encouraged to return if redness begins to streak, extends beyond the markings, and/or fever or nausea/vomiting develop.  First dose of Clindamycin given in ED. Follow up with PCP on Monday for recheck. Ambulatory in ED without difficulty.  The patient has ben appropriately medically screened and/or stabilized in the ED. I have low suspicion for any other emergent medical condition which would require further screening, evaluation or treatment in the ED or require inpatient management.  Patient is hemodynamically stable and in no acute distress.  Patient able to ambulate in department prior to ED.  Evaluation does not show acute pathology that would require ongoing or additional emergent interventions while in the emergency department or further inpatient treatment.  I have discussed the diagnosis with the patient and answered all questions.  Patient has no further complaints prior to discharge.  Patient is comfortable with plan discussed in room and is stable for discharge at this time.  I have discussed strict return precautions for returning to the emergency department.  Patient was encouraged to follow-up with PCP/specialist refer to at discharge.   Patient has been discussed with attending Dr. Wilson Singer who agrees with above treatment and plan.   Final Clinical Impressions(s) / ED Diagnoses   Final diagnoses:  Cellulitis of right lower extremity    ED Discharge Orders         Ordered    clindamycin (CLEOCIN) 300 MG capsule  3 times daily     08/01/18 1924            Rosalva Neary A, PA-C 08/01/18 1934    Virgel Manifold, MD 08/01/18 2045

## 2018-08-11 ENCOUNTER — Other Ambulatory Visit: Payer: Self-pay | Admitting: Interventional Cardiology

## 2018-08-13 DIAGNOSIS — L03115 Cellulitis of right lower limb: Secondary | ICD-10-CM | POA: Diagnosis not present

## 2018-08-24 DIAGNOSIS — L03115 Cellulitis of right lower limb: Secondary | ICD-10-CM | POA: Diagnosis not present

## 2018-09-01 ENCOUNTER — Other Ambulatory Visit: Payer: Self-pay | Admitting: Interventional Cardiology

## 2018-09-01 DIAGNOSIS — D6869 Other thrombophilia: Secondary | ICD-10-CM | POA: Diagnosis not present

## 2018-09-01 DIAGNOSIS — L03115 Cellulitis of right lower limb: Secondary | ICD-10-CM | POA: Diagnosis not present

## 2018-09-04 ENCOUNTER — Other Ambulatory Visit: Payer: Self-pay | Admitting: Interventional Cardiology

## 2018-10-03 ENCOUNTER — Other Ambulatory Visit: Payer: Self-pay | Admitting: Interventional Cardiology

## 2018-11-06 ENCOUNTER — Other Ambulatory Visit: Payer: Self-pay | Admitting: Interventional Cardiology

## 2018-11-19 ENCOUNTER — Telehealth: Payer: Self-pay | Admitting: *Deleted

## 2018-11-19 NOTE — Telephone Encounter (Signed)

## 2018-11-21 ENCOUNTER — Other Ambulatory Visit: Payer: Self-pay | Admitting: Interventional Cardiology

## 2018-11-23 ENCOUNTER — Telehealth (INDEPENDENT_AMBULATORY_CARE_PROVIDER_SITE_OTHER): Payer: Medicare Other | Admitting: Cardiology

## 2018-11-23 ENCOUNTER — Encounter: Payer: Self-pay | Admitting: Cardiology

## 2018-11-23 ENCOUNTER — Other Ambulatory Visit: Payer: Self-pay

## 2018-11-23 VITALS — BP 115/74 | HR 72 | Ht 74.0 in | Wt 229.0 lb

## 2018-11-23 DIAGNOSIS — I10 Essential (primary) hypertension: Secondary | ICD-10-CM

## 2018-11-23 DIAGNOSIS — G4733 Obstructive sleep apnea (adult) (pediatric): Secondary | ICD-10-CM | POA: Diagnosis not present

## 2018-11-23 DIAGNOSIS — E669 Obesity, unspecified: Secondary | ICD-10-CM | POA: Diagnosis not present

## 2018-11-23 NOTE — Patient Instructions (Signed)
Medication Instructions:   If you need a refill on your cardiac medications before your next appointment, please call your pharmacy.   Lab work: If you have labs (blood work) drawn today and your tests are completely normal, you will receive your results only by: Marland Kitchen MyChart Message (if you have MyChart) OR . A paper copy in the mail If you have any lab test that is abnormal or we need to change your treatment, we will call you to review the results.  Testing/Procedures: None ordered today.  Follow-Up: At [facility], you and your health needs are our priority.  As part of our continuing mission to provide you with exceptional heart care, we have created designated Provider Care Teams.  These Care Teams include your primary Cardiologist (physician) and Advanced Practice Providers (APPs -  Physician Assistants and Nurse Practitioners) who all work together to provide you with the care you need, when you need it. You will need a follow up appointment in 1 years.  Please call our office 2 months in advance to schedule this appointment.  You may see Dr. Radford Pax or one of the following Advanced Practice Providers on your designated Care Team:   [place], PA-C Melina Copa, PA-C . Ermalinda Barrios, PA-C

## 2018-11-23 NOTE — Progress Notes (Signed)
Virtual Visit via Video Note   This visit type was conducted due to national recommendations for restrictions regarding the COVID-19 Pandemic (e.g. social distancing) in an effort to limit this patient's exposure and mitigate transmission in our community.  Due to his co-morbid illnesses, this patient is at least at moderate risk for complications without adequate follow up.  This format is felt to be most appropriate for this patient at this time.  All issues noted in this document were discussed and addressed.  A limited physical exam was performed with this format.  Please refer to the patient's chart for his consent to telehealth for [facility].   Evaluation Performed:  Follow-up visit  This visit type was conducted due to national recommendations for restrictions regarding the COVID-19 Pandemic (e.g. social distancing).  This format is felt to be most appropriate for this patient at this time.  All issues noted in this document were discussed and addressed.  No physical exam was performed (except for noted visual exam findings with Video Visits).  Please refer to the patient's chart (MyChart message for video visits and phone note for telephone visits) for the patient's consent to telehealth for [facility].  Date:  11/23/2018   ID:  Antonio Mendoza, DOB [birth year], MRN [id number]  Patient Location:  Home  Provider location:   [place]  PCP:  Maury Dus, MD  Sleep Medicine: Fransico Him, MD Electrophysiologist:  None   Chief Complaint:  OSA  History of Present Illness:    Antonio Mendoza is a 85 y.o. male who presents via audio/video conferencing for a telehealth visit today.    Antonio Mendoza is a 85 y.o. male with a hx of mild OSA (AHI 5.24/hr) and is on CPAP at 12cm H2O.  He is doing well with his CPAP device and thinks that he has gotten used to it.  He tolerates the mask and feels the pressure is adequate.  Since going on CPAP he feels rested in the am and has no  significant daytime sleepiness.  He denies any significant mouth or nasal dryness or nasal congestion.  He does not think that he snores.    The patient does not have symptoms concerning for COVID-19 infection (fever, chills, cough, or new shortness of breath).   Prior CV studies:   The following studies were reviewed today:  PAP compliance download  Past Medical History:  Diagnosis Date  . Bilateral lower extremity edema 07/30/2016  . Edema 07/26/2013  . Hypogonadism male    followed by urologist   . Kidney stones   . Obesity (BMI 30-39.9) 01/17/2014  . OSA (obstructive sleep apnea)    on CPAP  . Other primary cardiomyopathies 07/26/2013  . Permanent atrial fibrillation ([facility])    No past surgical history on file.   Current Meds  Medication Sig  . ELIQUIS 5 MG TABS tablet TAKE 1 TABLET BY MOUTH TWICE A DAY  . furosemide (LASIX) 40 MG tablet Take 1 tablet (40 mg total) by mouth daily. Please schedule an office visit  . lisinopril (ZESTRIL) 5 MG tablet Take 1 tablet (5 mg total) by mouth daily. Please schedule an office visit  . metoprolol succinate (TOPROL-XL) 100 MG 24 hr tablet TAKE 1 TABLET BY MOUTH EVERY DAY  . NON FORMULARY CBD OIL     Allergies:   Nitrofuran derivatives   Social History   Tobacco Use  . Smoking status: Former Research scientist (life sciences)  . Smokeless tobacco: Never Used  Substance Use  Topics  . Alcohol use: Yes  . Drug use: No     Family Hx: The patient's family history includes CAD in his brother; Heart attack in his mother. There is no history of Hypertension or Stroke.  ROS:   Please see the history of present illness.     All other systems reviewed and are negative.   Labs/Other Tests and Data Reviewed:    Recent Labs: 08/01/2018: BUN 15; Creatinine, Ser 1.16; Hemoglobin 14.7; Platelets 231; Potassium 3.4; Sodium 141   Recent Lipid Panel Lab Results  Component Value Date/Time   CHOL 150 07/31/2016 11:04 AM   TRIG 99 07/31/2016 11:04 AM   HDL 39 (L)  07/31/2016 11:04 AM   CHOLHDL 3.8 07/31/2016 11:04 AM   LDLCALC 91 07/31/2016 11:04 AM    Wt Readings from Last 3 Encounters:  11/23/18 229 lb (103.9 kg)  08/08/17 249 lb (112.9 kg)  08/05/17 251 lb 6.4 oz (114 kg)     Objective:    Vital Signs:  BP 115/74   Pulse 72   Ht 6\' 2"  (1.88 m)   Wt 229 lb (103.9 kg)   SpO2 97%   BMI 29.40 kg/m     ASSESSMENT & PLAN:   1.  OSA -The patient is tolerating PAP therapy well without any problems. The PAP download was reviewed today and showed an AHI of 3.2/hr on 12 cm H2O with 100% compliance in using more than 4 hours nightly.  The patient has been using and benefiting from PAP use and will continue to benefit from therapy.   2.  HTN -BP controlled on exam -continue Lisinopril 5mg  daily and Toprol XL 100mg  daily  3.  Obesity -I have encouraged him to get into a routine exercise program and cut back on carbs and portions.   COVID-19 Education: The signs and symptoms of COVID-19 were discussed with the patient and how to seek care for testing (follow up with PCP or arrange E-visit).  The importance of social distancing was discussed today.  Patient Risk:   After full review of this patient's clinical status, I feel that they are at least moderate risk at this time.  Time:   Today, I have spent 20 minutes directly with the patient on telemedicine discussing medical problems including OSA, HTN, obesity.  We also reviewed the symptoms of COVID 19 and the ways to protect against contracting the virus with telehealth technology.  I spent an additional 5 minutes reviewing patient's chart including PAP compliance download.  Medication Adjustments/Labs and Tests Ordered: Current medicines are reviewed at length with the patient today.  Concerns regarding medicines are outlined above.  Tests Ordered: No orders of the defined types were placed in this encounter.  Medication Changes: No orders of the defined types were placed in this  encounter.   Disposition:  Follow up in 1 year(s)  Signed, Fransico Him, MD  11/23/2018 11:03 AM    [facility]

## 2018-11-24 ENCOUNTER — Other Ambulatory Visit: Payer: Self-pay | Admitting: Interventional Cardiology

## 2018-12-21 ENCOUNTER — Telehealth: Payer: Self-pay

## 2018-12-21 NOTE — Telephone Encounter (Signed)
Virtual Visit Pre-Appointment Phone Call  TELEPHONE CALL NOTE  Antonio Mendoza has been deemed a candidate for a follow-up tele-health visit to limit community exposure during the Covid-19 pandemic. I spoke with the patient via phone to ensure availability of phone/video source, confirm preferred email & phone number, and discuss instructions and expectations.  I reminded Antonio Mendoza to be prepared with any vital sign and/or heart rhythm information that could potentially be obtained via home monitoring, at the time of his visit. I reminded Antonio Mendoza to expect a phone call prior to his visit.  Patient agrees with consent below.  Cleon Gustin, RN 12/21/2018 2:16 PM    FULL LENGTH CONSENT FOR TELE-HEALTH VISIT   I hereby voluntarily request, consent and authorize [facility] and its employed or contracted physicians, physician assistants, nurse practitioners or other licensed health care professionals (the Practitioner), to provide me with telemedicine health care services (the "Services") as deemed necessary by the treating Practitioner. I acknowledge and consent to receive the Services by the Practitioner via telemedicine. I understand that the telemedicine visit will involve communicating with the Practitioner through live audiovisual communication technology and the disclosure of certain medical information by electronic transmission. I acknowledge that I have been given the opportunity to request an in-person assessment or other available alternative prior to the telemedicine visit and am voluntarily participating in the telemedicine visit.  I understand that I have the right to withhold or withdraw my consent to the use of telemedicine in the course of my care at any time, without affecting my right to future care or treatment, and that the Practitioner or I may terminate the telemedicine visit at any time. I understand that I have the right to inspect all information  obtained and/or recorded in the course of the telemedicine visit and may receive copies of available information for a reasonable fee.  I understand that some of the potential risks of receiving the Services via telemedicine include:  Marland Kitchen Delay or interruption in medical evaluation due to technological equipment failure or disruption; . Information transmitted may not be sufficient (e.g. poor resolution of images) to allow for appropriate medical decision making by the Practitioner; and/or  . In rare instances, security protocols could fail, causing a breach of personal health information.  Furthermore, I acknowledge that it is my responsibility to provide information about my medical history, conditions and care that is complete and accurate to the best of my ability. I acknowledge that Practitioner's advice, recommendations, and/or decision may be based on factors not within their control, such as incomplete or inaccurate data provided by me or distortions of diagnostic images or specimens that may result from electronic transmissions. I understand that the practice of medicine is not an exact science and that Practitioner makes no warranties or guarantees regarding treatment outcomes. I acknowledge that I will receive a copy of this consent concurrently upon execution via email to the email address I last provided but may also request a printed copy by calling the office of [facility].    I understand that my insurance will be billed for this visit.   I have read or had this consent read to me. . I understand the contents of this consent, which adequately explains the benefits and risks of the Services being provided via telemedicine.  . I have been provided ample opportunity to ask questions regarding this consent and the Services and have had my questions answered to my satisfaction. Marland Kitchen  I give my informed consent for the services to be provided through the use of telemedicine in my medical care   By participating in this telemedicine visit I agree to the above.

## 2018-12-22 NOTE — Progress Notes (Signed)
Virtual Visit via Telephone Note   This visit type was conducted due to national recommendations for restrictions regarding the COVID-19 Pandemic (e.g. social distancing) in an effort to limit this patient's exposure and mitigate transmission in our community.  Due to his co-morbid illnesses, this patient is at least at moderate risk for complications without adequate follow up.  This format is felt to be most appropriate for this patient at this time.  The patient did not have access to video technology/had technical difficulties with video requiring transitioning to audio format only (telephone).  All issues noted in this document were discussed and addressed.  No physical exam could be performed with this format.  Please refer to the patient's chart for his  consent to telehealth for [facility].   Date:  12/23/2018   ID:  Antonio Mendoza, DOB 05-23-1929, MRN [id number]  Patient Location: Home Provider Location: Home  PCP:  Maury Dus, MD  Cardiologist:  Larae Grooms, MD   Electrophysiologist:  None  Sleep medicine Dr. Radford Pax Evaluation Performed:  Follow-Up Visit  Chief Complaint:  F/U  History of Present Illness:    Antonio Mendoza is a 85 y.o. male with history of chronic atrial fibrillation on Eliquis, history of mild LVD which resolved with chronic lower extremity edema, OSA on CPAP.    Last saw Dr. Irish Lack 07/2017 and was having difficulty losing weight.  Patient denies any chest pain, dyspnea, dizziness or syncope. Edema controlled with compression stockings and lasix. Had cellulitis in June treated by PCP. 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 Working 40 hrs/week. Shoots skeet competitively every weekend. No regular walking. Has some balance issues. Has lost 22 lbs in the past year.     The patient does not have symptoms concerning for COVID-19 infection (fever, chills, cough, or new shortness of breath).     Past Medical History:  Diagnosis Date  . Bilateral lower extremity edema 07/30/2016  . Edema 07/26/2013  . Hypogonadism male    followed by urologist   . Kidney stones   . Obesity (BMI 30-39.9) 01/17/2014  . OSA (obstructive sleep apnea)    on CPAP  . Other primary cardiomyopathies 07/26/2013  . Permanent atrial fibrillation (HCC)    No past surgical history on file.   Current Meds  Medication Sig  . ELIQUIS 5 MG TABS tablet TAKE 1 TABLET BY MOUTH TWICE A DAY  . furosemide (LASIX) 40 MG tablet TAKE 1 TABLET (40 MG TOTAL) BY MOUTH DAILY. PLEASE SCHEDULE AN OFFICE VISIT  . lisinopril (ZESTRIL) 5 MG tablet TAKE 1 TABLET (5 MG TOTAL) BY MOUTH DAILY. PLEASE SCHEDULE AN OFFICE VISIT  . metoprolol succinate (TOPROL-XL) 100 MG 24 hr tablet Take 1 tablet (100 mg total) by mouth daily. Need to call and schedule an appointment with Dr Irish Lack for further refills 3rd/final attempt  . NON FORMULARY CBD OIL     Allergies:   Nitrofuran derivatives   Social History   Tobacco Use  . Smoking status: Former Research scientist (life sciences)  . Smokeless tobacco: Never Used  Substance Use Topics  . Alcohol use: Yes  . Drug use: No     Family Hx: The patient's family history includes CAD in his brother; Heart attack in his mother. There is no history of Hypertension or Stroke.  ROS:   Please see the history of present illness.      All other systems reviewed and are negative.   Prior  CV studies:   The following studies were reviewed today:  2D echo 2012 LVEF 50 to 55% dilated atrium trace of aortic valve regurgitation, LVEF improved since 2011.  Labs/Other Tests and Data Reviewed:    EKG:  No ECG reviewed.  Recent Labs: 08/01/2018: BUN 15; Creatinine, Ser 1.16; Hemoglobin 14.7; Platelets 231; Potassium 3.4; Sodium 141   Recent Lipid Panel Lab Results  Component Value Date/Time   CHOL 150 07/31/2016 11:04 AM   TRIG 99 07/31/2016 11:04 AM   HDL 39 (L) 07/31/2016 11:04 AM   CHOLHDL 3.8 07/31/2016 11:04  AM   LDLCALC 91 07/31/2016 11:04 AM    Wt Readings from Last 3 Encounters:  12/23/18 227 lb (103 kg)  11/23/18 229 lb (103.9 kg)  08/08/17 249 lb (112.9 kg)     Objective:    Vital Signs:  BP 118/73   Pulse 69   Ht 6\' 2"  (1.88 m)   Wt 227 lb (103 kg)   SpO2 98%   BMI 29.15 kg/m    VITAL SIGNS:  reviewed  ASSESSMENT & PLAN:    1. Permanent atrial fibrillation on Eliquis. Renal and CBC stable 07/2018. Recheck next month. No bleeding problems. Encourage regular exercise. 2. Cardiomyopathy resolved 3. Venous insufficiency with chronic leg edema-stable on lasix and compression hose. Eating frozen dinners. 2 gm sodium diet 4. OSA on CPAP-compliant. followed by Dr. Radford Pax  COVID-19 Education: The signs and symptoms of COVID-19 were discussed with the patient and how to seek care for testing (follow up with PCP or arrange E-visit).   The importance of social distancing was discussed today.  Time:   Today, I have spent  20:15 minutes with the patient with telehealth technology discussing the above problems.     Medication Adjustments/Labs and Tests Ordered: Current medicines are reviewed at length with the patient today.  Concerns regarding medicines are outlined above.   Tests Ordered: No orders of the defined types were placed in this encounter.   Medication Changes: No orders of the defined types were placed in this encounter.   Follow Up:  Either In Person or Virtual in 6 month(s)Dr. Kennyth Lose, Ermalinda Barrios, PA-C  12/23/2018 11:50 AM    [place]

## 2018-12-23 ENCOUNTER — Encounter: Payer: Self-pay | Admitting: Physician Assistant

## 2018-12-23 ENCOUNTER — Telehealth (INDEPENDENT_AMBULATORY_CARE_PROVIDER_SITE_OTHER): Payer: Medicare Other | Admitting: Physician Assistant

## 2018-12-23 ENCOUNTER — Other Ambulatory Visit: Payer: Self-pay

## 2018-12-23 VITALS — BP 118/73 | HR 69 | Ht 74.0 in | Wt 227.0 lb

## 2018-12-23 DIAGNOSIS — G4733 Obstructive sleep apnea (adult) (pediatric): Secondary | ICD-10-CM | POA: Diagnosis not present

## 2018-12-23 DIAGNOSIS — Z8679 Personal history of other diseases of the circulatory system: Secondary | ICD-10-CM | POA: Diagnosis not present

## 2018-12-23 DIAGNOSIS — I4821 Permanent atrial fibrillation: Secondary | ICD-10-CM | POA: Diagnosis not present

## 2018-12-23 DIAGNOSIS — R600 Localized edema: Secondary | ICD-10-CM | POA: Diagnosis not present

## 2018-12-23 NOTE — Patient Instructions (Addendum)
Medication Instructions:  Your physician recommends that you continue on your current medications as directed. Please refer to the Current Medication list given to you today.  *If you need a refill on your cardiac medications before your next appointment, please call your pharmacy*  Lab Work: Your physician recommends that you return for lab work (BMET, CBC)  If you have labs (blood work) drawn today and your tests are completely normal, you will receive your results only by: Marland Kitchen MyChart Message (if you have MyChart) OR . A paper copy in the mail If you have any lab test that is abnormal or we need to change your treatment, we will call you to review the results.  Testing/Procedures: None ordered  Follow-Up: At [facility], you and your health needs are our priority.  As part of our continuing mission to provide you with exceptional heart care, we have created designated Provider Care Teams.  These Care Teams include your primary Cardiologist (physician) and Advanced Practice Providers (APPs -  Physician Assistants and Nurse Practitioners) who all work together to provide you with the care you need, when you need it.  Your next appointment:   6 months  The format for your next appointment:   Either In Person or Virtual  Provider:   You may see Larae Grooms, MD or one of the following Advanced Practice Providers on your designated Care Team:    Melina Copa, PA-C  Ermalinda Barrios, PA-C   Other Instructions  Two Gram Sodium Diet 2000 mg  What is Sodium? Sodium is a mineral found naturally in many foods. The most significant source of sodium in the diet is table salt, which is about 40% sodium.  Processed, convenience, and preserved foods also contain a large amount of sodium.  The body needs only 500 mg of sodium daily to function,  A normal diet provides more than enough sodium even if you do not use salt.  Why Limit Sodium? A build up of sodium in the body can cause thirst,  increased blood pressure, shortness of breath, and water retention.  Decreasing sodium in the diet can reduce edema and risk of heart attack or stroke associated with high blood pressure.  Keep in mind that there are many other factors involved in these health problems.  Heredity, obesity, lack of exercise, cigarette smoking, stress and what you eat all play a role.  General Guidelines:  Do not add salt at the table or in cooking.  One teaspoon of salt contains over 2 grams of sodium.  Read food labels  Avoid processed and convenience foods  Ask your dietitian before eating any foods not dicussed in the menu planning guidelines  Consult your physician if you wish to use a salt substitute or a sodium containing medication such as antacids.  Limit milk and milk products to 16 oz (2 cups) per day.  Shopping Hints:  READ LABELS!! "Dietetic" does not necessarily mean low sodium.  Salt and other sodium ingredients are often added to foods during processing.   Menu Planning Guidelines Food Group Choose More Often Avoid  Beverages (see also the milk group All fruit juices, low-sodium, salt-free vegetables juices, low-sodium carbonated beverages Regular vegetable or tomato juices, commercially softened water used for drinking or cooking  Breads and Cereals Enriched white, wheat, rye and pumpernickel bread, hard rolls and dinner rolls; muffins, cornbread and waffles; most dry cereals, cooked cereal without added salt; unsalted crackers and breadsticks; low sodium or homemade bread crumbs Bread, rolls and  crackers with salted tops; quick breads; instant hot cereals; pancakes; commercial bread stuffing; self-rising flower and biscuit mixes; regular bread crumbs or cracker crumbs  Desserts and Sweets Desserts and sweets mad with mild should be within allowance Instant pudding mixes and cake mixes  Fats Butter or margarine; vegetable oils; unsalted salad dressings, regular salad dressings limited to 1  Tbs; light, sour and heavy cream Regular salad dressings containing bacon fat, bacon bits, and salt pork; snack dips made with instant soup mixes or processed cheese; salted nuts  Fruits Most fresh, frozen and canned fruits Fruits processed with salt or sodium-containing ingredient (some dried fruits are processed with sodium sulfites        Vegetables Fresh, frozen vegetables and low- sodium canned vegetables Regular canned vegetables, sauerkraut, pickled vegetables, and others prepared in brine; frozen vegetables in sauces; vegetables seasoned with ham, bacon or salt pork  Condiments, Sauces, Miscellaneous  Salt substitute with physician's approval; pepper, herbs, spices; vinegar, lemon or lime juice; hot pepper sauce; garlic powder, onion powder, low sodium soy sauce (1 Tbs.); low sodium condiments (ketchup, chili sauce, mustard) in limited amounts (1 tsp.) fresh ground horseradish; unsalted tortilla chips, pretzels, potato chips, popcorn, salsa (1/4 cup) Any seasoning made with salt including garlic salt, celery salt, onion salt, and seasoned salt; sea salt, rock salt, kosher salt; meat tenderizers; monosodium glutamate; mustard, regular soy sauce, barbecue, sauce, chili sauce, teriyaki sauce, steak sauce, Worcestershire sauce, and most flavored vinegars; canned gravy and mixes; regular condiments; salted snack foods, olives, picles, relish, horseradish sauce, catsup   Food preparation: Try these seasonings Meats:    Pork Sage, onion Serve with applesauce  Chicken Poultry seasoning, thyme, parsley Serve with cranberry sauce  Lamb Curry powder, rosemary, garlic, thyme Serve with mint sauce or jelly  Veal Marjoram, basil Serve with current jelly, cranberry sauce  Beef Pepper, bay leaf Serve with dry mustard, unsalted chive butter  Fish Bay leaf, dill Serve with unsalted lemon butter, unsalted parsley butter  Vegetables:    Asparagus Lemon juice   Broccoli Lemon juice   Carrots Mustard  dressing parsley, mint, nutmeg, glazed with unsalted butter and sugar   Green beans Marjoram, lemon juice, nutmeg,dill seed   Tomatoes Basil, marjoram, onion   Spice /blend for [employer]" 4 tsp ground thyme 1 tsp ground sage 3 tsp ground rosemary 4 tsp ground marjoram   Test your knowledge 1. A product that says "Salt Free" may still contain sodium. True or False 2. Garlic Powder and Hot Pepper Sauce an be used as alternative seasonings.True or False 3. Processed foods have more sodium than fresh foods.  True or False 4. Canned Vegetables have less sodium than froze True or False  WAYS TO DECREASE YOUR SODIUM INTAKE 1. Avoid the use of added salt in cooking and at the table.  Table salt (and other prepared seasonings which contain salt) is probably one of the greatest sources of sodium in the diet.  Unsalted foods can gain flavor from the sweet, sour, and butter taste sensations of herbs and spices.  Instead of using salt for seasoning, try the following seasonings with the foods listed.  Remember: how you use them to enhance natural food flavors is limited only by your creativity... Allspice-Meat, fish, eggs, fruit, peas, red and yellow vegetables Almond Extract-Fruit baked goods Anise Seed-Sweet breads, fruit, carrots, beets, cottage cheese, cookies (tastes like licorice) Basil-Meat, fish, eggs, vegetables, rice, vegetables salads, soups, sauces Bay Leaf-Meat, fish, stews, poultry Burnet-Salad, vegetables (cucumber-like flavor)  Caraway Seed-Bread, cookies, cottage cheese, meat, vegetables, cheese, rice Cardamon-Baked goods, fruit, soups Celery Powder or seed-Salads, salad dressings, sauces, meatloaf, soup, bread.Do not use  celery salt Chervil-Meats, salads, fish, eggs, vegetables, cottage cheese (parsley-like flavor) Chili Power-Meatloaf, chicken cheese, corn, eggplant, egg dishes Chives-Salads cottage cheese, egg dishes, soups, vegetables, sauces Cilantro-Salsa,  casseroles Cinnamon-Baked goods, fruit, pork, lamb, chicken, carrots Cloves-Fruit, baked goods, fish, pot roast, green beans, beets, carrots Coriander-Pastry, cookies, meat, salads, cheese (lemon-orange flavor) Cumin-Meatloaf, fish,cheese, eggs, cabbage,fruit pie (caraway flavor) [employer], fruit, eggs, fish, poultry, cottage cheese, vegetables Dill Seed-Meat, cottage cheese, poultry, vegetables, fish, salads, bread Fennel Seed-Bread, cookies, apples, pork, eggs, fish, beets, cabbage, cheese, Licorice-like flavor Garlic-(buds or powder) Salads, meat, poultry, fish, bread, butter, vegetables, potatoes.Do not  use garlic salt Ginger-Fruit, vegetables, baked goods, meat, fish, poultry Horseradish Root-Meet, vegetables, butter Lemon Juice or Extract-Vegetables, fruit, tea, baked goods, fish salads Mace-Baked goods fruit, vegetables, fish, poultry (taste like nutmeg) Maple Extract-Syrups Marjoram-Meat, chicken, fish, vegetables, breads, green salads (taste like Sage) Mint-Tea, lamb, sherbet, vegetables, desserts, carrots, cabbage Mustard, Dry or Seed-Cheese, eggs, meats, vegetables, poultry Nutmeg-Baked goods, fruit, chicken, eggs, vegetables, desserts Onion Powder-Meat, fish, poultry, vegetables, cheese, eggs, bread, rice salads (Do not use   Onion salt) Orange Extract-Desserts, baked goods Oregano-Pasta, eggs, cheese, onions, pork, lamb, fish, chicken, vegetables, green salads Paprika-Meat, fish, poultry, eggs, cheese, vegetables Parsley Flakes-Butter, vegetables, meat fish, poultry, eggs, bread, salads (certain forms may   Contain sodium Pepper-Meat fish, poultry, vegetables, eggs Peppermint Extract-Desserts, baked goods Poppy Seed-Eggs, bread, cheese, fruit dressings, baked goods, noodles, vegetables, cottage  [employer], poultry, meat, fish, cauliflower, turnips,eggs bread Saffron-Rice, bread, veal, chicken, fish, eggs Sage-Meat, fish,  poultry, onions, eggplant, tomateos, pork, stews Savory-Eggs, salads, poultry, meat, rice, vegetables, soups, pork Tarragon-Meat, poultry, fish, eggs, butter, vegetables (licorice-like flavor)  Thyme-Meat, poultry, fish, eggs, vegetables, (clover-like flavor), sauces, soups Tumeric-Salads, butter, eggs, fish, rice, vegetables (saffron-like flavor) Vanilla Extract-Baked goods, candy Vinegar-Salads, vegetables, meat marinades Walnut Extract-baked goods, candy  2. Choose your Foods Wisely   The following is a list of foods to avoid which are high in sodium:  Meats-Avoid all smoked, canned, salt cured, dried and kosher meat and fish as well as Anchovies   [employer] meats:Bologna, Liverwurst, Pastrami Canned meat or fish  Marinated herring Caviar    Pepperoni Corned Beef   Pizza Dried chipped beef  Salami Frozen breaded fish or meat Salt pork Frankfurters or hot dogs  Sardines Gefilte fish   Sausage Ham (boiled ham, Proscuitto Smoked butt    spiced ham)   Spam      TV Dinners Vegetables Canned vegetables (Regular) Relish Canned mushrooms  Sauerkraut Olives    Tomato juice Pickles  Bakery and Dessert Products Canned puddings  Cream pies Cheesecake   Decorated cakes Cookies  Beverages/Juices Tomato juice, regular  Gatorade   V-8 vegetable juice, regular  Breads and Cereals Biscuit mixes   Salted potato chips, corn chips, pretzels Bread stuffing mixes  Salted crackers and rolls Pancake and waffle mixes Self-rising flour  Seasonings Accent    Meat sauces Barbecue sauce  Meat tenderizer Catsup    Monosodium glutamate (MSG) Celery salt   Onion salt Chili sauce   Prepared mustard Garlic salt   Salt, seasoned salt, sea salt Gravy mixes   Soy sauce Horseradish   Steak sauce Ketchup   Tartar sauce Lite salt    Teriyaki sauce Marinade mixes   Worcestershire sauce  Others Baking  powder   Cocoa and cocoa mixes Baking soda   Commercial casserole  mixes Candy-caramels, chocolate  Dehydrated soups    Bars, fudge,nougats  Instant rice and pasta mixes Canned broth or soup  Maraschino cherries Cheese, aged and processed cheese and cheese spreads  Learning Assessment Quiz  Indicated T (for True) or F (for False) for each of the following statements:  1. _____ Fresh fruits and vegetables and unprocessed grains are generally low in sodium 2. _____ Water may contain a considerable amount of sodium, depending on the source 3. _____ You can always tell if a food is high in sodium by tasting it 4. _____ Certain laxatives my be high in sodium and should be avoided unless prescribed   by a physician or pharmacist 5. _____ Salt substitutes may be used freely by anyone on a sodium restricted diet 6. _____ Sodium is present in table salt, food additives and as a natural component of   most foods 7. _____ Table salt is approximately 90% sodium 8. _____ Limiting sodium intake may help prevent excess fluid accumulation in the body 9. _____ On a sodium-restricted diet, seasonings such as bouillon soy sauce, and    cooking wine should be used in place of table salt 10. _____ On an ingredient list, a product which lists monosodium glutamate as the first   ingredient is an appropriate food to include on a low sodium diet  Circle the best answer(s) to the following statements (Hint: there may be more than one correct answer)  11. On a low-sodium diet, some acceptable snack items are:    A. Olives  F. Bean dip   K. Grapefruit juice    B. Salted Pretzels G. Commercial Popcorn   L. Canned peaches    C. Carrot Sticks  H. Bouillon   M. Unsalted nuts   D. [place] fries  I. Peanut butter crackers N. Salami   E. Sweet pickles J. Tomato Juice   O. Pizza  12.  Seasonings that may be used freely on a reduced - sodium diet include   A. Lemon wedges F.Monosodium glutamate K. Celery seed    B.Soysauce   G. Pepper   L. Mustard powder   C. Sea salt  H.  Cooking wine  M. Onion flakes   D. Vinegar  E. Prepared horseradish N. Salsa   E. Sage   J. Worcestershire sauce  O. Chutney

## 2018-12-29 DIAGNOSIS — Z23 Encounter for immunization: Secondary | ICD-10-CM | POA: Diagnosis not present

## 2019-01-08 ENCOUNTER — Other Ambulatory Visit: Payer: Self-pay | Admitting: Interventional Cardiology

## 2019-01-08 MED ORDER — APIXABAN 5 MG PO TABS: 5.0000 mg | ORAL_TABLET | Freq: Two times a day (BID) | ORAL | 1 refills | Status: DC

## 2019-01-08 NOTE — Telephone Encounter (Signed)
Prescription refill request for Eliquis received.  Last office visit: Antonio Mendoza, 12/23/2018 Scr: 08/01/2018,  1.16 Age: 85 y.o Weight: 103 kg  Prescription refill sent.

## 2019-03-01 DIAGNOSIS — Z0000 Encounter for general adult medical examination without abnormal findings: Secondary | ICD-10-CM | POA: Diagnosis not present

## 2019-03-01 DIAGNOSIS — I4891 Unspecified atrial fibrillation: Secondary | ICD-10-CM | POA: Diagnosis not present

## 2019-03-01 DIAGNOSIS — N1831 Chronic kidney disease, stage 3a: Secondary | ICD-10-CM | POA: Diagnosis not present

## 2019-03-01 DIAGNOSIS — I10 Essential (primary) hypertension: Secondary | ICD-10-CM | POA: Diagnosis not present

## 2019-03-01 DIAGNOSIS — Z1389 Encounter for screening for other disorder: Secondary | ICD-10-CM | POA: Diagnosis not present

## 2019-03-01 DIAGNOSIS — N390 Urinary tract infection, site not specified: Secondary | ICD-10-CM | POA: Diagnosis not present

## 2019-03-01 DIAGNOSIS — D751 Secondary polycythemia: Secondary | ICD-10-CM | POA: Diagnosis not present

## 2019-03-01 DIAGNOSIS — R7303 Prediabetes: Secondary | ICD-10-CM | POA: Diagnosis not present

## 2019-03-01 DIAGNOSIS — I428 Other cardiomyopathies: Secondary | ICD-10-CM | POA: Diagnosis not present

## 2019-04-04 ENCOUNTER — Ambulatory Visit: Payer: Medicare Other | Attending: Internal Medicine

## 2019-04-04 DIAGNOSIS — Z23 Encounter for immunization: Secondary | ICD-10-CM | POA: Insufficient documentation

## 2019-04-04 NOTE — Progress Notes (Signed)
   Covid-19 Vaccination Clinic  Name:  Antonio Mendoza    MRN: [id number] DOB: 11-28-1929  04/04/2019  [name] was observed post Covid-19 immunization for 15 minutes without incidence. He was provided with Vaccine Information Sheet and instruction to access the V-Safe system.   [name] was instructed to call 911 with any severe reactions post vaccine: Marland Kitchen Difficulty breathing  . Swelling of your face and throat  . A fast heartbeat  . A bad rash all over your body  . Dizziness and weakness    Immunizations Administered    Name Date Dose VIS Date Route   Pfizer COVID-19 Vaccine 04/04/2019 10:43 AM 0.3 mL 01/29/2019 Intramuscular   Manufacturer: [place]   Lot: [id number]   [place]: [id number]

## 2019-04-27 ENCOUNTER — Ambulatory Visit: Payer: Medicare Other | Attending: Internal Medicine

## 2019-04-27 DIAGNOSIS — Z23 Encounter for immunization: Secondary | ICD-10-CM | POA: Insufficient documentation

## 2019-04-27 NOTE — Progress Notes (Signed)
   Covid-19 Vaccination Clinic  Name:  Antonio Mendoza    MRN: [id number] DOB: [birth year]  04/27/2019  Mr. Oppedisano was observed post Covid-19 immunization for 15 minutes without incident. He was provided with Vaccine Information Sheet and instruction to access the V-Safe system.   Mr. Boor was instructed to call 911 with any severe reactions post vaccine: Marland Kitchen Difficulty breathing  . Swelling of face and throat  . A fast heartbeat  . A bad rash all over body  . Dizziness and weakness   Immunizations Administered    Name Date Dose VIS Date Route   Pfizer COVID-19 Vaccine 04/27/2019 12:43 PM 0.3 mL 01/29/2019 Intramuscular   Manufacturer: [place]   Lot: [id number]   [place]: [id number]

## 2019-06-01 DIAGNOSIS — N1831 Chronic kidney disease, stage 3a: Secondary | ICD-10-CM | POA: Diagnosis not present

## 2019-06-01 DIAGNOSIS — I10 Essential (primary) hypertension: Secondary | ICD-10-CM | POA: Diagnosis not present

## 2019-06-01 DIAGNOSIS — I4891 Unspecified atrial fibrillation: Secondary | ICD-10-CM | POA: Diagnosis not present
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
## 2019-08-13 ENCOUNTER — Ambulatory Visit (INDEPENDENT_AMBULATORY_CARE_PROVIDER_SITE_OTHER): Payer: Medicare Other | Admitting: Interventional Cardiology

## 2019-08-13 ENCOUNTER — Other Ambulatory Visit: Payer: Self-pay

## 2019-08-13 ENCOUNTER — Encounter: Payer: Self-pay | Admitting: Interventional Cardiology

## 2019-08-13 VITALS — BP 118/64 | HR 82 | Ht 74.0 in | Wt 233.8 lb

## 2019-08-13 DIAGNOSIS — E669 Obesity, unspecified: Secondary | ICD-10-CM

## 2019-08-13 DIAGNOSIS — R600 Localized edema: Secondary | ICD-10-CM

## 2019-08-13 DIAGNOSIS — Z8679 Personal history of other diseases of the circulatory system: Secondary | ICD-10-CM

## 2019-08-13 DIAGNOSIS — R7303 Prediabetes: Secondary | ICD-10-CM

## 2019-08-13 DIAGNOSIS — I4821 Permanent atrial fibrillation: Secondary | ICD-10-CM

## 2019-08-13 DIAGNOSIS — G4733 Obstructive sleep apnea (adult) (pediatric): Secondary | ICD-10-CM

## 2019-08-13 DIAGNOSIS — I10 Essential (primary) hypertension: Secondary | ICD-10-CM

## 2019-08-13 NOTE — Patient Instructions (Signed)
Medication Instructions:  Your physician recommends that you continue on your current medications as directed. Please refer to the Current Medication list given to you today.  *If you need a refill on your cardiac medications before your next appointment, please call your pharmacy*   Lab Work: TODAY: CBC, CMET, A1C  If you have labs (blood work) drawn today and your tests are completely normal, you will receive your results only by: Marland Kitchen MyChart Message (if you have MyChart) OR . A paper copy in the mail If you have any lab test that is abnormal or we need to change your treatment, we will call you to review the results.   Testing/Procedures: None   Follow-Up: At [facility], you and your health needs are our priority.  As part of our continuing mission to provide you with exceptional heart care, we have created designated Provider Care Teams.  These Care Teams include your primary Cardiologist (physician) and Advanced Practice Providers (APPs -  Physician Assistants and Nurse Practitioners) who all work together to provide you with the care you need, when you need it.  We recommend signing up for the patient portal called "MyChart".  Sign up information is provided on this After Visit Summary.  MyChart is used to connect with patients for Virtual Visits (Telemedicine).  Patients are able to view lab/test results, encounter notes, upcoming appointments, etc.  Non-urgent messages can be sent to your provider as well.   To learn more about what you can do with MyChart, go to [email or web address].    Your next appointment:   12 month(s)  The format for your next appointment:   In Person  Provider:   You may see Larae Grooms, MD or one of the following Advanced Practice Providers on your designated Care Team:    Melina Copa, PA-C  Ermalinda Barrios, PA-C    Other Instructions None

## 2019-08-14 LAB — COMPREHENSIVE METABOLIC PANEL
ALT: 18 IU/L (ref 0–44)
AST: 21 IU/L (ref 0–40)
Albumin/Globulin Ratio: 1.4 (ref 1.2–2.2)
Albumin: 3.9 g/dL (ref 3.6–4.6)
Alkaline Phosphatase: 91 IU/L (ref 48–121)
BUN/Creatinine Ratio: 21 (ref 10–24)
BUN: 24 mg/dL (ref 8–27)
Bilirubin Total: 0.9 mg/dL (ref 0.0–1.2)
CO2: 22 mmol/L (ref 20–29)
Calcium: 9.4 mg/dL (ref 8.6–10.2)
Chloride: 105 mmol/L (ref 96–106)
Creatinine, Ser: 1.17 mg/dL (ref 0.76–1.27)
GFR calc Af Amer: 64 mL/min/{1.73_m2} (ref >59)
GFR calc non Af Amer: 56 mL/min/{1.73_m2} — ABNORMAL LOW (ref >59)
Globulin, Total: 2.8 g/dL (ref 1.5–4.5)
Glucose: 103 mg/dL — ABNORMAL HIGH (ref 65–99)
Potassium: 4.1 mmol/L (ref 3.5–5.2)
Sodium: 141 mmol/L (ref 134–144)
Total Protein: 6.7 g/dL (ref 6.0–8.5)

## 2019-08-14 LAB — CBC
Hematocrit: 43.6 % (ref 37.5–51.0)
Hemoglobin: 15.1 g/dL (ref 13.0–17.7)
MCH: 32.7 pg (ref 26.6–33.0)
MCHC: 34.6 g/dL (ref 31.5–35.7)
MCV: 94 fL (ref 79–97)
Platelets: 207 10*3/uL (ref 150–450)
RBC: 4.62 x10E6/uL (ref 4.14–5.80)
RDW: 13.1 % (ref 11.6–15.4)
WBC: 9.1 10*3/uL (ref 3.4–10.8)

## 2019-08-14 LAB — HEMOGLOBIN A1C
Est. average glucose Bld gHb Est-mCnc: 126 mg/dL
Hgb A1c MFr Bld: 6 % — ABNORMAL HIGH (ref 4.8–5.6)

## 2019-09-14 DIAGNOSIS — N1831 Chronic kidney disease, stage 3a: Secondary | ICD-10-CM | POA: Diagnosis not present

## 2019-09-14 DIAGNOSIS — I4891 Unspecified atrial fibrillation: Secondary | ICD-10-CM | POA: Diagnosis not present

## 2019-09-14 DIAGNOSIS — I10 Essential (primary) hypertension: Secondary | ICD-10-CM | POA: Diagnosis not present

## 2019-09-30 ENCOUNTER — Other Ambulatory Visit: Payer: Self-pay | Admitting: Interventional Cardiology

## 2019-09-30 NOTE — Telephone Encounter (Signed)
Pt last saw Dr Irish Lack 08/13/19, last labs 08/13/19 Creat 1.17, age 86, weight 106.1kg, based on specified criteria pt is on appropriate dosage of Eliquis 5mg  BID.  Will refill rx.

## 2019-11-24 DIAGNOSIS — I4891 Unspecified atrial fibrillation: Secondary | ICD-10-CM | POA: Diagnosis not present

## 2019-11-24 DIAGNOSIS — Z23 Encounter for immunization: Secondary | ICD-10-CM | POA: Diagnosis not present

## 2019-11-24 DIAGNOSIS — N1831 Chronic kidney disease, stage 3a: Secondary | ICD-10-CM | POA: Diagnosis not present

## 2019-11-24 DIAGNOSIS — I10 Essential (primary) hypertension: Secondary | ICD-10-CM | POA: Diagnosis not present

## 2019-11-28 ENCOUNTER — Other Ambulatory Visit: Payer: Self-pay | Admitting: Cardiology

## 2019-12-26 ENCOUNTER — Other Ambulatory Visit: Payer: Self-pay | Admitting: Interventional Cardiology

## 2020-01-25 ENCOUNTER — Other Ambulatory Visit: Payer: Self-pay | Admitting: Cardiology

## 2020-01-25 DIAGNOSIS — I10 Essential (primary) hypertension: Secondary | ICD-10-CM

## 2020-03-02 ENCOUNTER — Telehealth: Payer: Self-pay | Admitting: Interventional Cardiology

## 2020-03-02 MED ORDER — APIXABAN 5 MG PO TABS: 5.0000 mg | ORAL_TABLET | Freq: Two times a day (BID) | ORAL | 1 refills | Status: AC

## 2020-03-02 NOTE — Telephone Encounter (Signed)
Pt last saw Dr Varanasi 08/13/19, last labs 08/13/19 Creat 1.17, age 87, weight 106.1kg, based on specified criteria pt is on appropriate dosage of Eliquis 5mg BID.  Will refill rx.  

## 2020-03-02 NOTE — Telephone Encounter (Signed)
*  STAT* If patient is at the pharmacy, call can be transferred to refill team.   1. Which medications need to be refilled? (please list name of each medication and dose if known) ELIQUIS 5 MG TABS tablet  2. Which pharmacy/location (including street and city if local pharmacy) is medication to be sent to? [facility] [address], [place] [address]  3. Do they need a 30 day or 90 day supply? 90 day supply   Pt is out of town with 1 days worth of medication left.

## 2020-03-21 ENCOUNTER — Encounter: Payer: Self-pay | Admitting: Physician Assistant
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
## 2020-03-22 ENCOUNTER — Telehealth (INDEPENDENT_AMBULATORY_CARE_PROVIDER_SITE_OTHER): Payer: Medicare Other | Admitting: Physician Assistant

## 2020-03-22 ENCOUNTER — Telehealth: Payer: Self-pay | Admitting: *Deleted

## 2020-03-22 ENCOUNTER — Encounter: Payer: Self-pay | Admitting: Physician Assistant

## 2020-03-22 ENCOUNTER — Other Ambulatory Visit: Payer: Self-pay

## 2020-03-22 VITALS — BP 122/72 | HR 67 | Ht 74.0 in | Wt 233.8 lb

## 2020-03-22 DIAGNOSIS — G4733 Obstructive sleep apnea (adult) (pediatric): Secondary | ICD-10-CM | POA: Diagnosis not present

## 2020-03-22 DIAGNOSIS — I4821 Permanent atrial fibrillation: Secondary | ICD-10-CM | POA: Diagnosis not present

## 2020-03-22 DIAGNOSIS — Z9989 Dependence on other enabling machines and devices: Secondary | ICD-10-CM

## 2020-03-22 DIAGNOSIS — I519 Heart disease, unspecified: Secondary | ICD-10-CM | POA: Diagnosis not present

## 2020-03-22 DIAGNOSIS — R7303 Prediabetes: Secondary | ICD-10-CM

## 2020-03-22 NOTE — Telephone Encounter (Signed)
  Patient Consent for Virtual Visit         Antonio Mendoza has provided verbal consent on 03/22/2020 for a virtual visit (video or telephone).   CONSENT FOR VIRTUAL VISIT FOR:  Antonio Mendoza  By participating in this virtual visit I agree to the following:  I hereby voluntarily request, consent and authorize [facility] and its employed or contracted physicians, physician assistants, nurse practitioners or other licensed health care professionals (the Practitioner), to provide me with telemedicine health care services (the "Services") as deemed necessary by the treating Practitioner. I acknowledge and consent to receive the Services by the Practitioner via telemedicine. I understand that the telemedicine visit will involve communicating with the Practitioner through live audiovisual communication technology and the disclosure of certain medical information by electronic transmission. I acknowledge that I have been given the opportunity to request an in-person assessment or other available alternative prior to the telemedicine visit and am voluntarily participating in the telemedicine visit.  I understand that I have the right to withhold or withdraw my consent to the use of telemedicine in the course of my care at any time, without affecting my right to future care or treatment, and that the Practitioner or I may terminate the telemedicine visit at any time. I understand that I have the right to inspect all information obtained and/or recorded in the course of the telemedicine visit and may receive copies of available information for a reasonable fee.  I understand that some of the potential risks of receiving the Services via telemedicine include:  Marland Kitchen Delay or interruption in medical evaluation due to technological equipment failure or disruption; . Information transmitted may not be sufficient (e.g. poor resolution of images) to allow for appropriate medical decision making by the Practitioner;  and/or  . In rare instances, security protocols could fail, causing a breach of personal health information.  Furthermore, I acknowledge that it is my responsibility to provide information about my medical history, conditions and care that is complete and accurate to the best of my ability. I acknowledge that Practitioner's advice, recommendations, and/or decision may be based on factors not within their control, such as incomplete or inaccurate data provided by me or distortions of diagnostic images or specimens that may result from electronic transmissions. I understand that the practice of medicine is not an exact science and that Practitioner makes no warranties or guarantees regarding treatment outcomes. I acknowledge that a copy of this consent can be made available to me via my patient portal ([facility]), or I can request a printed copy by calling the office of [facility].    I understand that my insurance will be billed for this visit.   I have read or had this consent read to me. . I understand the contents of this consent, which adequately explains the benefits and risks of the Services being provided via telemedicine.  . I have been provided ample opportunity to ask questions regarding this consent and the Services and have had my questions answered to my satisfaction. . I give my informed consent for the services to be provided through the use of telemedicine in my medical care

## 2020-03-22 NOTE — Patient Instructions (Addendum)
Medication Instructions:  Your physician recommends that you continue on your current medications as directed. Please refer to the Current Medication list given to you today.  *If you need a refill on your cardiac medications before your next appointment, please call your pharmacy*   Lab Work: None ordered  If you have labs (blood work) drawn today and your tests are completely normal, you will receive your results only by: Marland Kitchen MyChart Message (if you have MyChart) OR . A paper copy in the mail If you have any lab test that is abnormal or we need to change your treatment, we will call you to review the results.   Testing/Procedures: None ordered   Follow-Up: At [facility], you and your health needs are our priority.  As part of our continuing mission to provide you with exceptional heart care, we have created designated Provider Care Teams.  These Care Teams include your primary Cardiologist (physician) and Advanced Practice Providers (APPs -  Physician Assistants and Nurse Practitioners) who all work together to provide you with the care you need, when you need it.  We recommend signing up for the patient portal called "MyChart".  Sign up information is provided on this After Visit Summary.  MyChart is used to connect with patients for Virtual Visits (Telemedicine).  Patients are able to view lab/test results, encounter notes, upcoming appointments, etc.  Non-urgent messages can be sent to your provider as well.   To learn more about what you can do with MyChart, go to [email or web address].    Your next appointment:   AS NEEDED   The format for your next appointment:   In Person  Provider:   You may see Larae Grooms, MD or one of the following Advanced Practice Providers on your designated Care Team:    Melina Copa, PA-C  Ermalinda Barrios, PA-C    Other Instructions Good luck on your move!

## 2020-06-30 IMAGING — CR RIGHT TIBIA AND FIBULA - 2 VIEW
4 series · 4 of 4 positions shown · non-contrast
Comparison: None.

CLINICAL DATA: 88 y.o male reports 2 [REDACTED]s ago was out in field
shooting skeet and thought he had just got sunburned on RLE. Pt has
swelling and erythema to RLE. Nondiabetic. Pt is on blood thinners.

EXAM:
RIGHT TIBIA AND FIBULA - 2 VIEW

[x tib-fib ap right (1 of 2)]
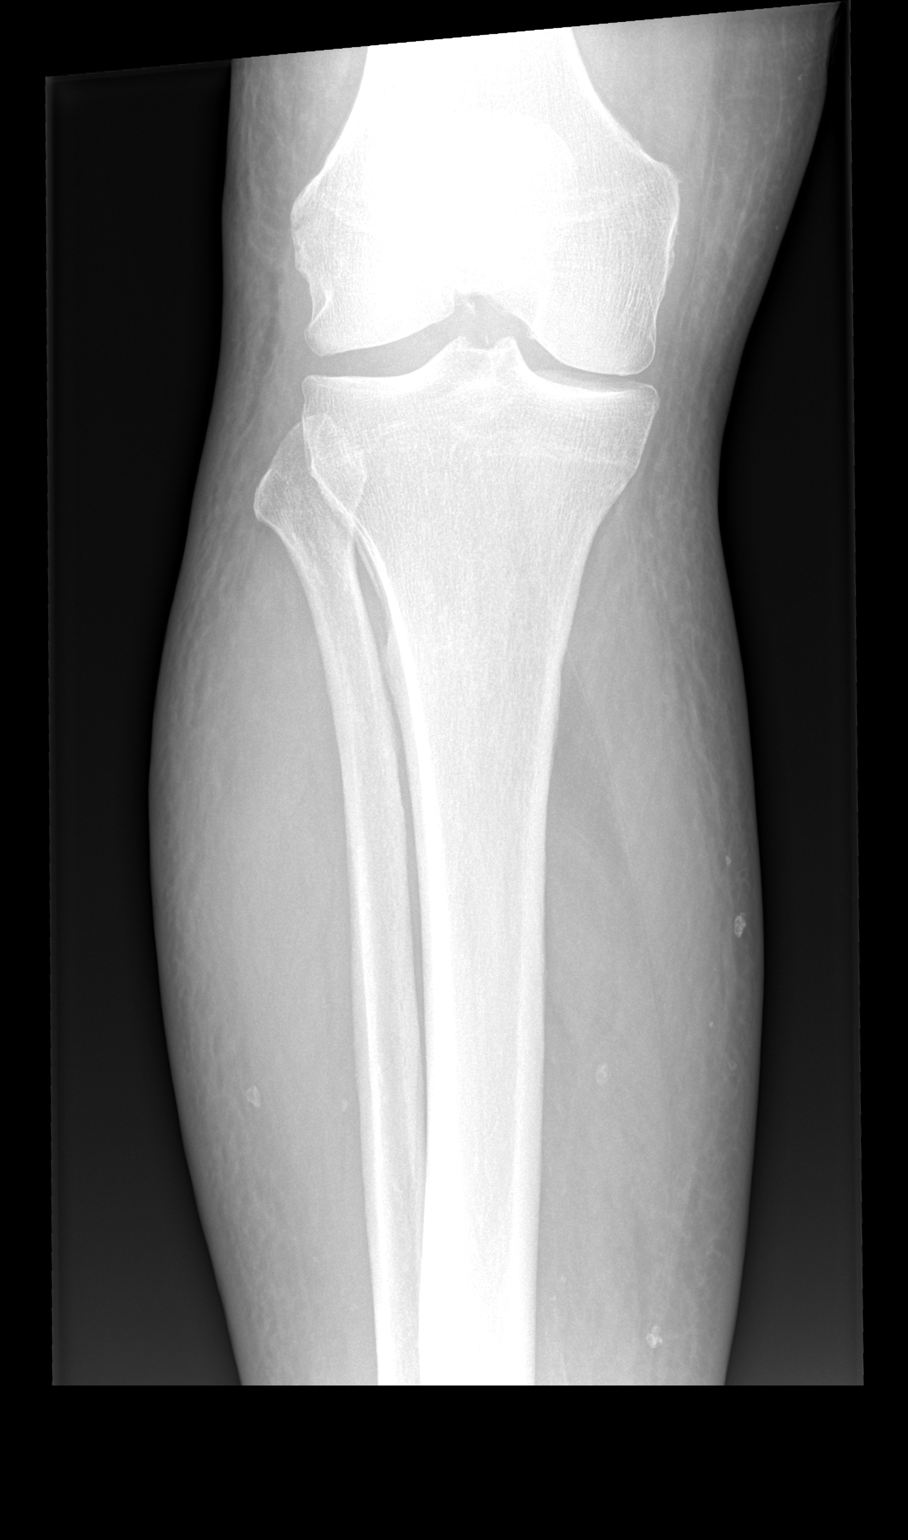

[x tib-fib ap right (2 of 2)]
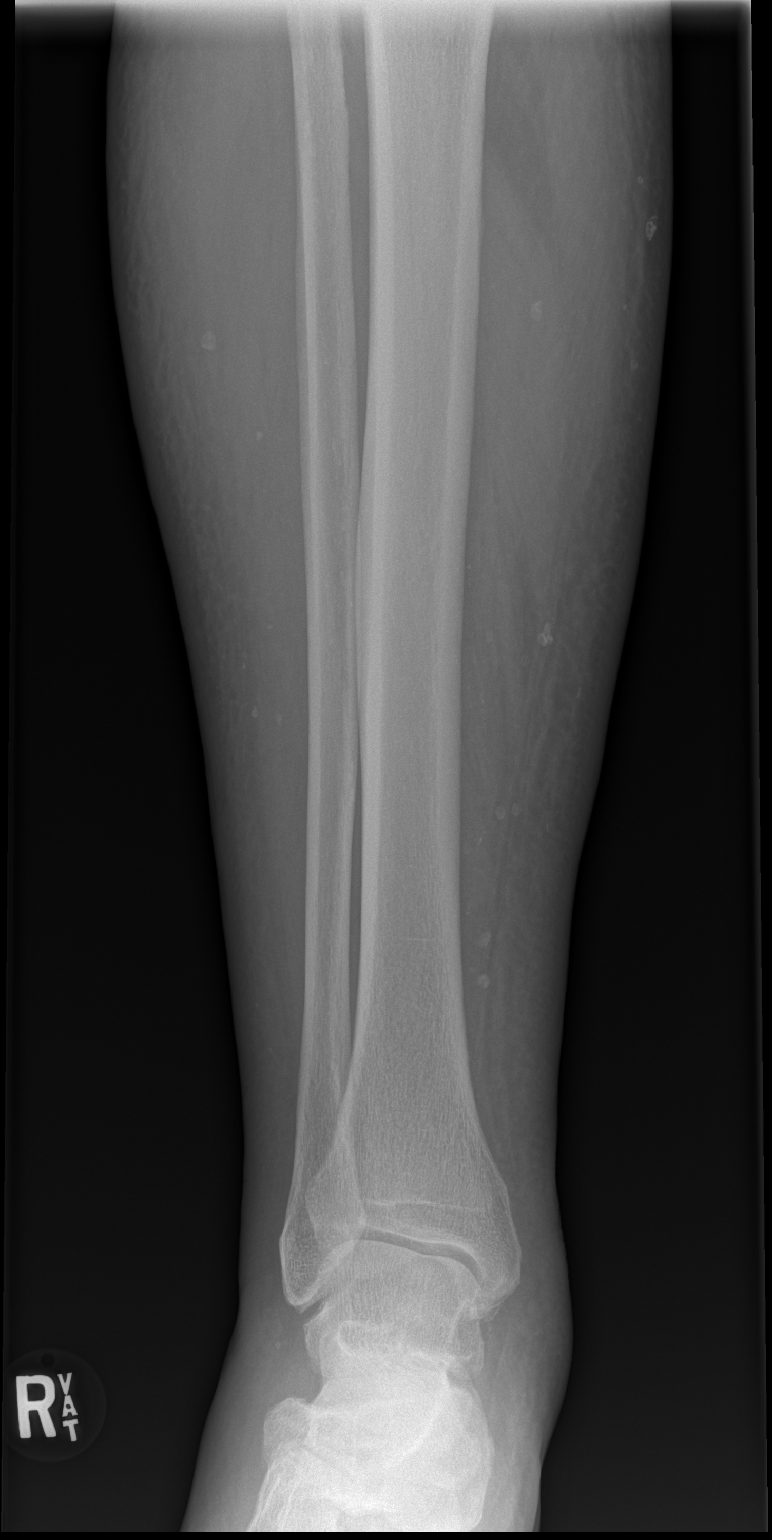

[x tib-fib lat right (1 of 2)]
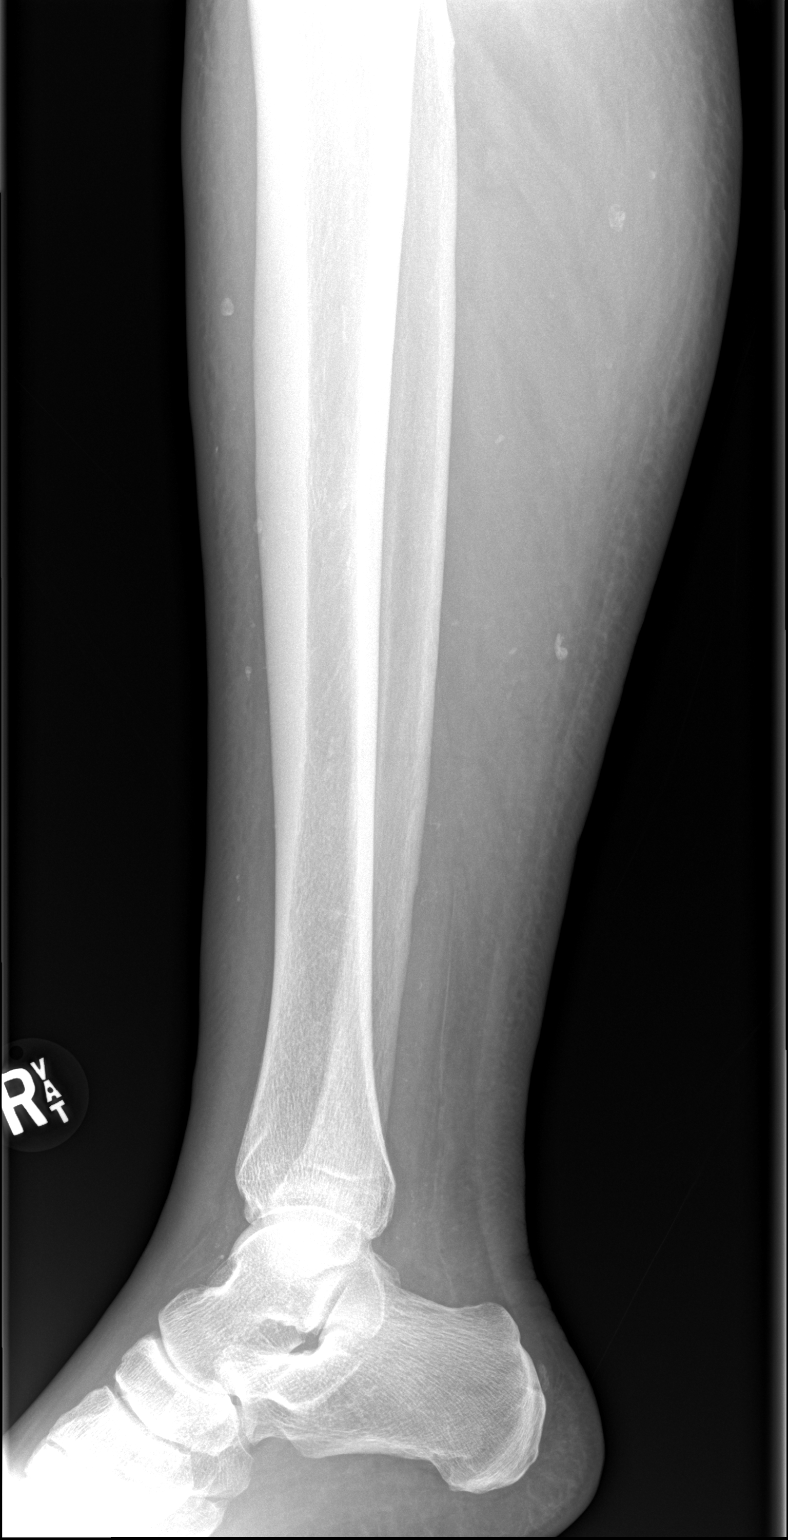

[x tib-fib lat right (2 of 2)]
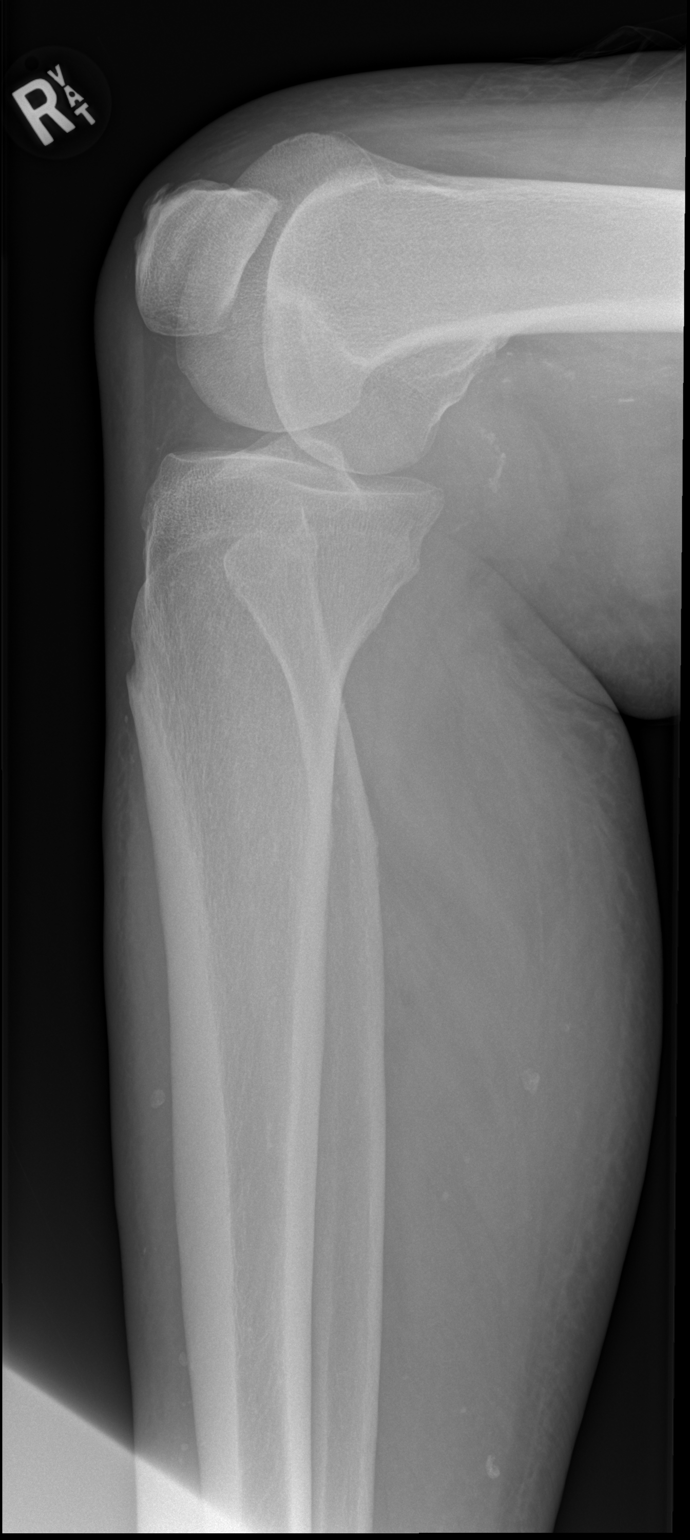

[4 of 4 positions shown; findings below may reference images not displayed]

FINDINGS: No fracture or bone lesion.

Knee and ankle joints are normally spaced and aligned.

There is nonspecific diffuse subcutaneous soft tissue edema. No soft
tissue air. Scattered phleboliths are noted.
IMPRESSION: 1. No fracture or bone lesion.
2. Nonspecific subcutaneous soft tissue edema.

## 2020-06-30 IMAGING — CR RIGHT FOOT COMPLETE - 3+ VIEW
3 series · 3 of 3 positions shown · non-contrast
Comparison: None

CLINICAL DATA: Swelling and redness to RIGHT lower extremity

EXAM:
RIGHT FOOT COMPLETE - 3+ VIEW

[x foot ap right]
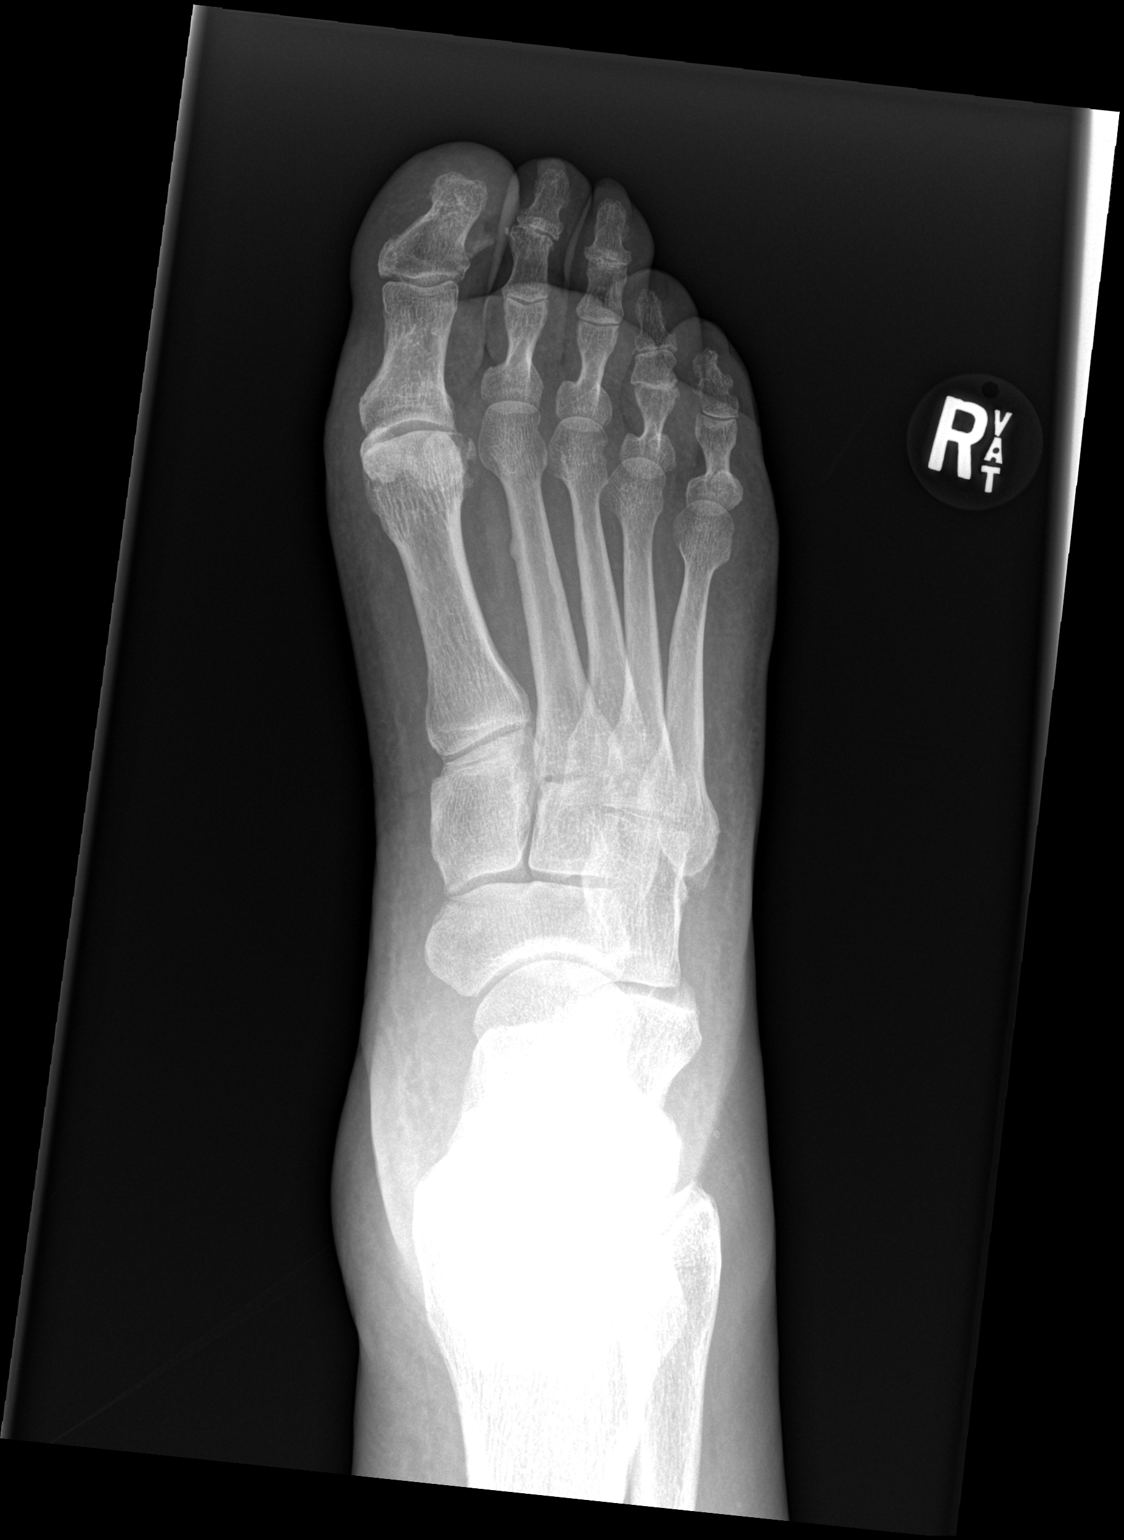

[x foot obl right]
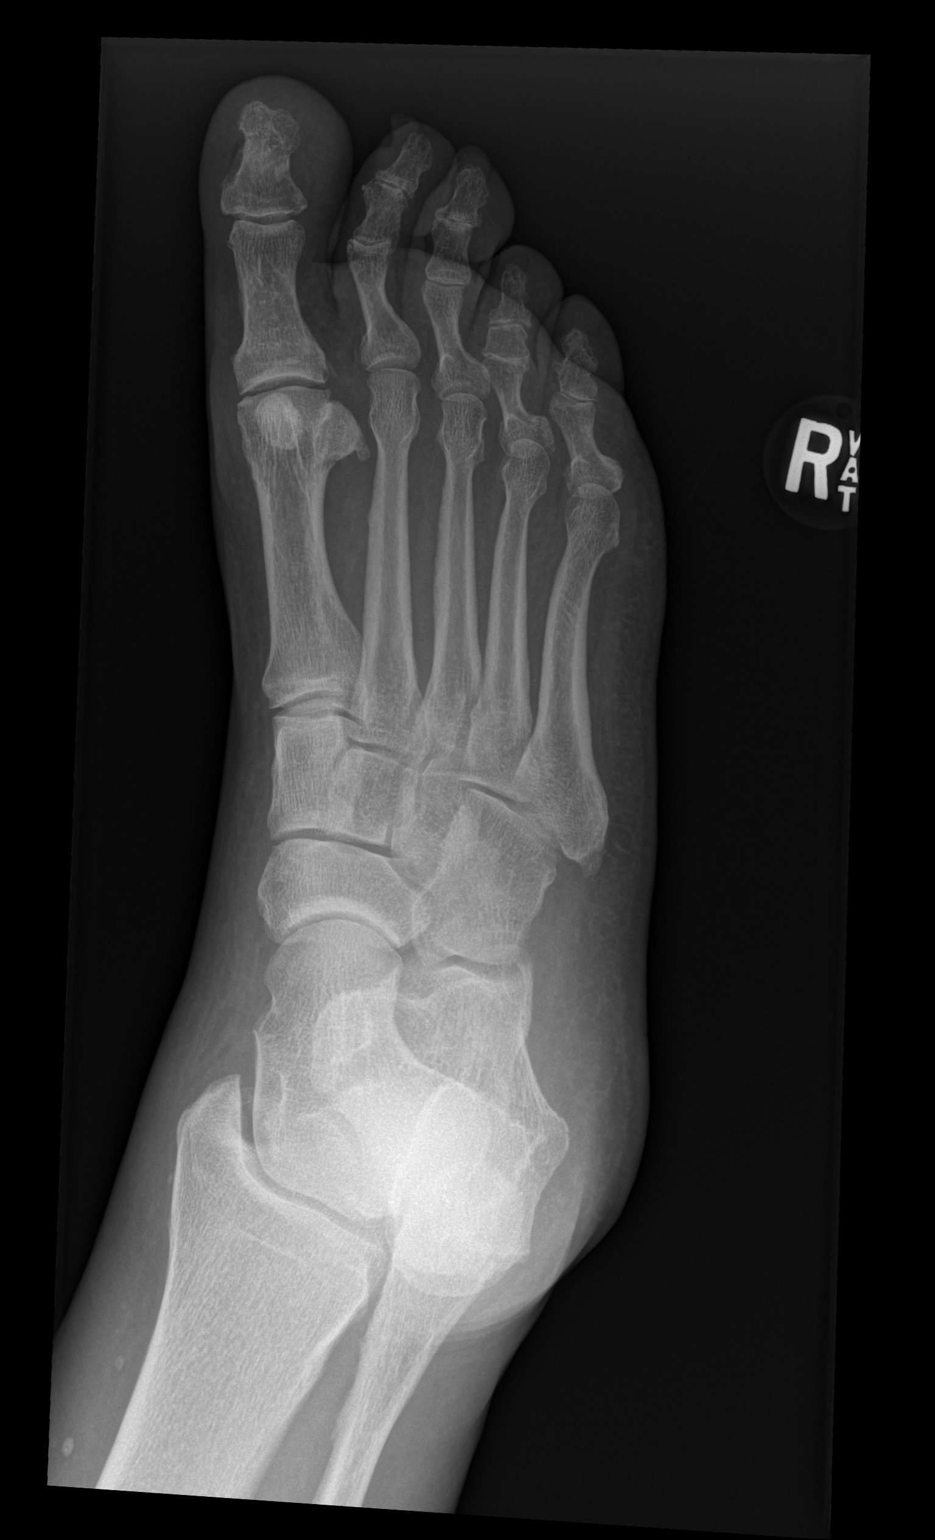

[x foot lat right]
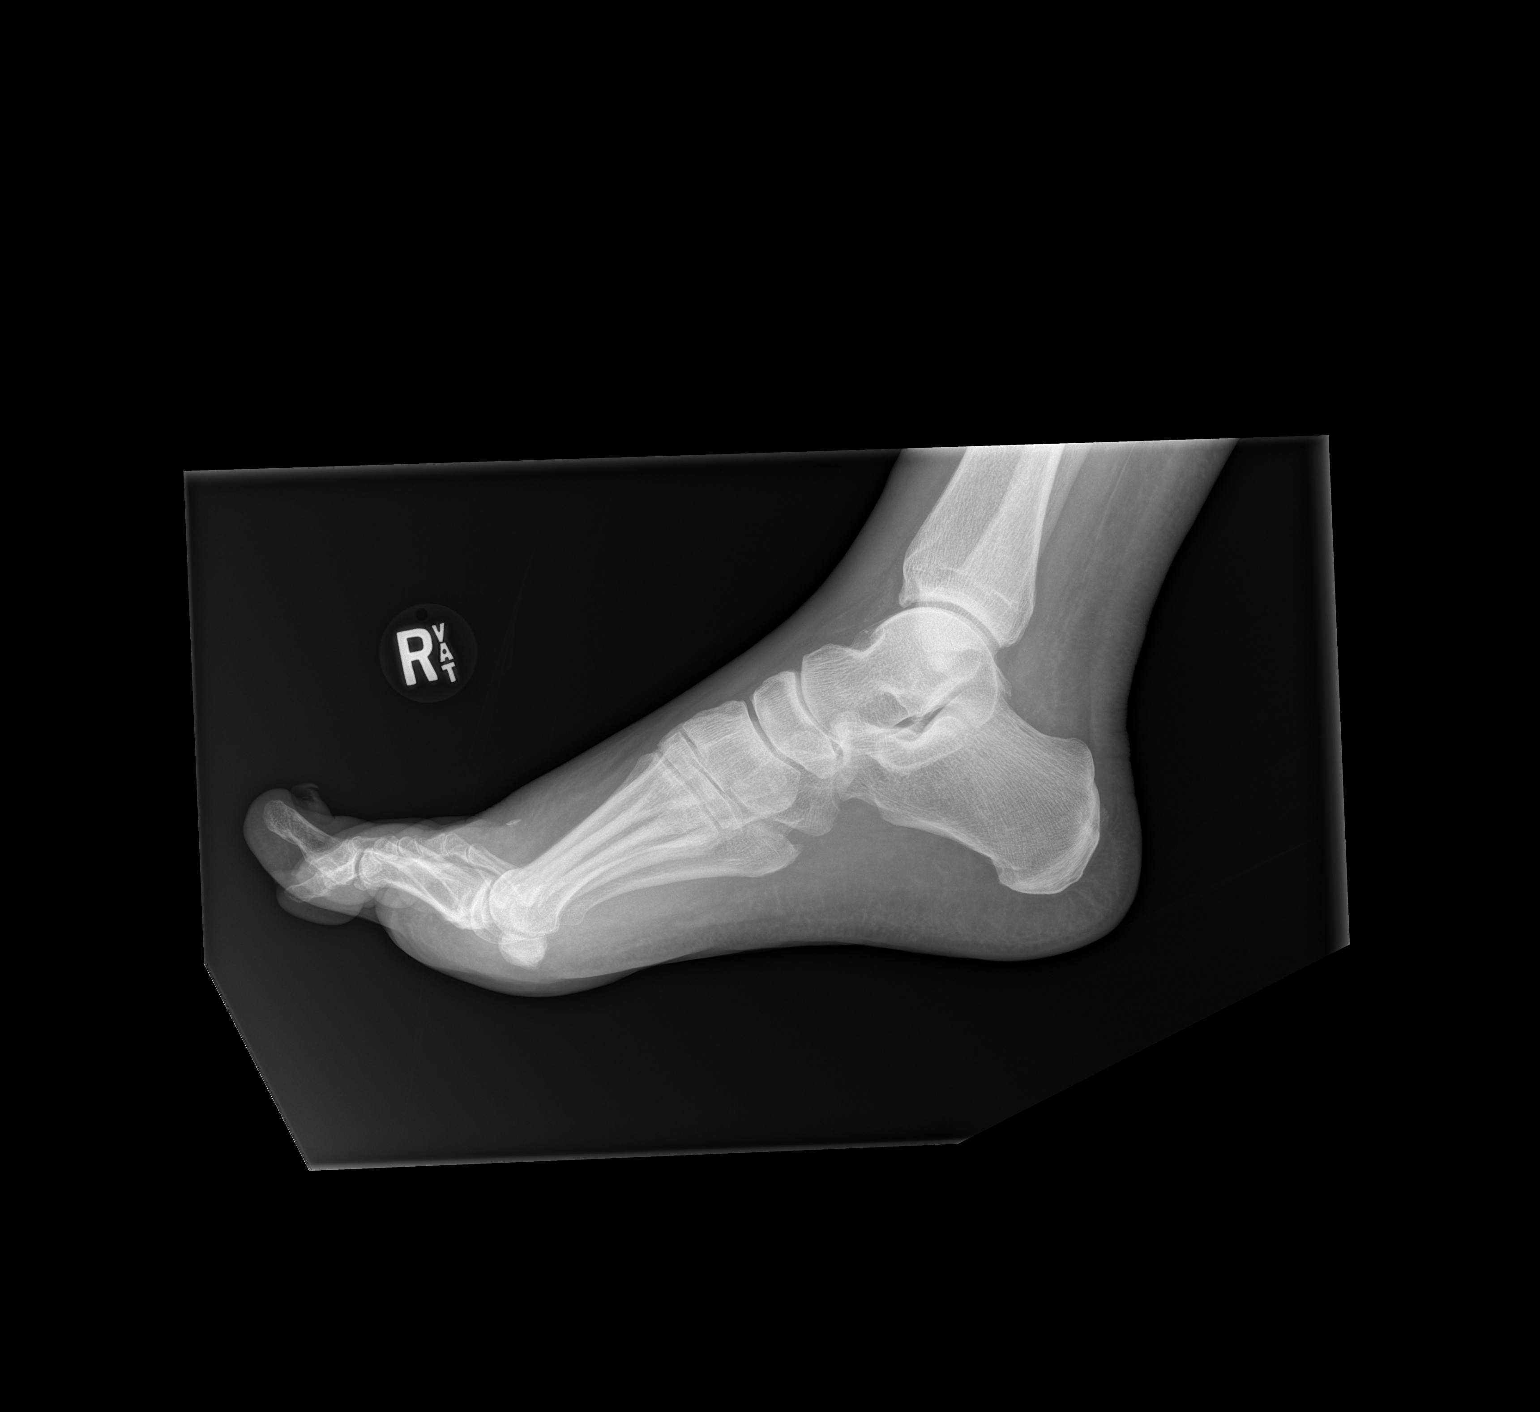

[3 of 3 positions shown; findings below may reference images not displayed]

FINDINGS: Osseous mineralization normal.

Minimal narrowing of first MTP joint and scattered IP joints.

Remain joint spaces preserved.

No acute fracture, dislocation, or bone destruction.

Mild soft tissue swelling at dorsum of foot.

Small soft tissue calcification at dorsum of foot at the level of
the metatarsal heads.
IMPRESSION: No acute osseous abnormalities.

## 2020-07-14 ENCOUNTER — Other Ambulatory Visit: Payer: Self-pay | Admitting: Interventional Cardiology

## 2020-10-15 ENCOUNTER — Other Ambulatory Visit: Payer: Self-pay | Admitting: Interventional Cardiology

## 2020-11-05 ENCOUNTER — Other Ambulatory Visit: Payer: Self-pay | Admitting: Interventional Cardiology

## 2020-11-06 NOTE — Telephone Encounter (Addendum)
Prescription refill request for Eliquis received. Indication: Afib  Last office visit: 03/22/20 Idolina Primer)  Scr: 1.17 (08/13/19) Age: 87 Weight: 106.1kg  Pt's labs overdue. Called and patient stated he has moved to [place] and now gets his Eliquis from Dr Renard Hamper at new office.

## 2020-12-25 ENCOUNTER — Other Ambulatory Visit: Payer: Self-pay | Admitting: Cardiology

## 2020-12-25 DIAGNOSIS — I10 Essential (primary) hypertension: Secondary | ICD-10-CM

## 2021-03-21 ENCOUNTER — Other Ambulatory Visit: Payer: Self-pay | Admitting: Cardiology

## 2021-03-21 DIAGNOSIS — I10 Essential (primary) hypertension: Secondary | ICD-10-CM

## 2021-04-07 ENCOUNTER — Other Ambulatory Visit: Payer: Self-pay | Admitting: Interventional Cardiology

## 2021-04-13 ENCOUNTER — Other Ambulatory Visit: Payer: Self-pay | Admitting: Cardiology

## 2021-04-13 DIAGNOSIS — I10 Essential (primary) hypertension: Secondary | ICD-10-CM

## 2021-04-20 ENCOUNTER — Other Ambulatory Visit: Payer: Self-pay | Admitting: Interventional Cardiology

## 2021-04-20 DIAGNOSIS — I10 Essential (primary) hypertension: Secondary | ICD-10-CM

## 2021-05-04 ENCOUNTER — Other Ambulatory Visit: Payer: Self-pay | Admitting: Interventional Cardiology

## 2021-05-06 ENCOUNTER — Other Ambulatory Visit: Payer: Self-pay | Admitting: Interventional Cardiology

## 2021-05-06 DIAGNOSIS — I10 Essential (primary) hypertension: Secondary | ICD-10-CM

## 2021-05-16 ENCOUNTER — Other Ambulatory Visit: Payer: Self-pay | Admitting: Interventional Cardiology

## 2021-05-16 DIAGNOSIS — I10 Essential (primary) hypertension: Secondary | ICD-10-CM

## 2021-05-24 ENCOUNTER — Other Ambulatory Visit: Payer: Self-pay | Admitting: Interventional Cardiology

## 2021-05-24 DIAGNOSIS — I10 Essential (primary) hypertension: Secondary | ICD-10-CM

## 2021-05-25 ENCOUNTER — Other Ambulatory Visit: Payer: Self-pay | Admitting: Interventional Cardiology

## 2021-07-19 ENCOUNTER — Other Ambulatory Visit: Payer: Self-pay | Admitting: Interventional Cardiology

## 2021-07-28 ENCOUNTER — Other Ambulatory Visit: Payer: Self-pay | Admitting: Interventional Cardiology
